# Patient Record
Sex: Female | Born: 1995 | Race: Black or African American | Hispanic: No | Marital: Single | Smoking: Former smoker
Health system: Southern US, Community
[De-identification: ages and names within clinical notes are randomized; demographics above are authoritative.]

## PROBLEM LIST (undated history)

## (undated) DIAGNOSIS — S60552A Superficial foreign body of left hand, initial encounter: Secondary | ICD-10-CM

## (undated) DIAGNOSIS — D649 Anemia, unspecified: Secondary | ICD-10-CM

## (undated) DIAGNOSIS — D573 Sickle-cell trait: Secondary | ICD-10-CM

---
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
## 2018-05-12 ENCOUNTER — Emergency Department (HOSPITAL_COMMUNITY): Payer: Medicaid Other

## 2018-05-12 ENCOUNTER — Emergency Department (HOSPITAL_COMMUNITY)
Admission: EM | Admit: 2018-05-12 | Discharge: 2018-05-12 | Disposition: A | Discharge status: Home / Self Care | Payer: Medicaid Other | Attending: Emergency Medicine | Admitting: Emergency Medicine

## 2018-05-12 ENCOUNTER — Other Ambulatory Visit: Payer: Self-pay

## 2018-05-12 ENCOUNTER — Encounter (HOSPITAL_COMMUNITY): Payer: Self-pay

## 2018-05-12 DIAGNOSIS — R1011 Right upper quadrant pain: Secondary | ICD-10-CM | POA: Insufficient documentation

## 2018-05-12 DIAGNOSIS — R110 Nausea: Secondary | ICD-10-CM | POA: Insufficient documentation

## 2018-05-12 DIAGNOSIS — R1031 Right lower quadrant pain: Secondary | ICD-10-CM | POA: Diagnosis not present

## 2018-05-12 DIAGNOSIS — D72828 Other elevated white blood cell count: Secondary | ICD-10-CM

## 2018-05-12 LAB — CBC WITH DIFFERENTIAL/PLATELET
BASOS PCT: 0 %
Basophils Absolute: 0 10*3/uL (ref 0.0–0.1)
Eosinophils Absolute: 0 10*3/uL (ref 0.0–0.7)
Eosinophils Relative: 0 %
HCT: 29.2 % — ABNORMAL LOW (ref 36.0–46.0)
HEMOGLOBIN: 9.2 g/dL — AB (ref 12.0–15.0)
LYMPHS ABS: 1.2 10*3/uL (ref 0.7–4.0)
Lymphocytes Relative: 5 %
MCH: 18 pg — AB (ref 26.0–34.0)
MCHC: 31.5 g/dL (ref 30.0–36.0)
MCV: 57.3 fL — AB (ref 78.0–100.0)
MONO ABS: 0.8 10*3/uL (ref 0.1–1.0)
MONOS PCT: 4 %
Neutro Abs: 19.9 10*3/uL — ABNORMAL HIGH (ref 1.7–7.7)
Neutrophils Relative %: 91 %
Platelets: 289 10*3/uL (ref 150–400)
RBC: 5.1 MIL/uL (ref 3.87–5.11)
RDW: 17.9 % — AB (ref 11.5–15.5)
WBC: 21.9 10*3/uL — ABNORMAL HIGH (ref 4.0–10.5)

## 2018-05-12 LAB — URINALYSIS, ROUTINE W REFLEX MICROSCOPIC
BACTERIA UA: NONE SEEN
BILIRUBIN URINE: NEGATIVE
GLUCOSE, UA: 50 mg/dL — AB
Ketones, ur: 5 mg/dL — AB
NITRITE: NEGATIVE
Protein, ur: 100 mg/dL — AB
RBC / HPF: >50 RBC/hpf — ABNORMAL HIGH (ref 0–5)
SPECIFIC GRAVITY, URINE: 1.03 (ref 1.005–1.030)
pH: 5 (ref 5.0–8.0)

## 2018-05-12 LAB — COMPREHENSIVE METABOLIC PANEL
ALK PHOS: 75 U/L (ref 38–126)
ALT: 11 U/L (ref 0–44)
ANION GAP: 8 (ref 5–15)
AST: 12 U/L — ABNORMAL LOW (ref 15–41)
Albumin: 3.4 g/dL — ABNORMAL LOW (ref 3.5–5.0)
BILIRUBIN TOTAL: 0.9 mg/dL (ref 0.3–1.2)
BUN: 8 mg/dL (ref 6–20)
CALCIUM: 9 mg/dL (ref 8.9–10.3)
CO2: 20 mmol/L — ABNORMAL LOW (ref 22–32)
Chloride: 113 mmol/L — ABNORMAL HIGH (ref 98–111)
Creatinine, Ser: 0.8 mg/dL (ref 0.44–1.00)
GLUCOSE: 109 mg/dL — AB (ref 70–99)
POTASSIUM: 3.8 mmol/L (ref 3.5–5.1)
Sodium: 141 mmol/L (ref 135–145)
TOTAL PROTEIN: 7.3 g/dL (ref 6.5–8.1)

## 2018-05-12 LAB — WET PREP, GENITAL
CLUE CELLS WET PREP: NONE SEEN
Sperm: NONE SEEN
TRICH WET PREP: NONE SEEN
YEAST WET PREP: NONE SEEN

## 2018-05-12 LAB — I-STAT BETA HCG BLOOD, ED (MC, WL, AP ONLY)

## 2018-05-12 LAB — LIPASE, BLOOD: LIPASE: 21 U/L (ref 11–51)

## 2018-05-12 MED ORDER — HYDROCODONE-ACETAMINOPHEN 5-325 MG PO TABS
1.0000 | ORAL_TABLET | Freq: Once | ORAL | Status: AC
  Administered 2018-05-12: 1 via ORAL
  Filled 2018-05-12: qty 1

## 2018-05-12 MED ORDER — HYDROMORPHONE HCL 1 MG/ML IJ SOLN
1.0000 mg | Freq: Once | INTRAMUSCULAR | Status: AC
  Administered 2018-05-12: 1 mg via INTRAVENOUS
  Filled 2018-05-12: qty 1

## 2018-05-12 MED ORDER — IOPAMIDOL (ISOVUE-300) INJECTION 61%
INTRAVENOUS | Status: AC
  Filled 2018-05-12: qty 100

## 2018-05-12 MED ORDER — HYDROCODONE-ACETAMINOPHEN 5-325 MG PO TABS: ORAL_TABLET | ORAL | 0 refills | Status: DC

## 2018-05-12 MED ORDER — DOXYCYCLINE HYCLATE 100 MG PO CAPS: 100.0000 mg | ORAL_CAPSULE | Freq: Two times a day (BID) | ORAL | 0 refills | Status: DC

## 2018-05-12 MED ORDER — SODIUM CHLORIDE 0.9 % IV BOLUS
1000.0000 mL | Freq: Once | INTRAVENOUS | Status: AC
Start: 2018-05-12 — End: 2018-05-12
  Administered 2018-05-12: 1000 mL via INTRAVENOUS

## 2018-05-12 MED ORDER — MORPHINE SULFATE (PF) 4 MG/ML IV SOLN
4.0000 mg | Freq: Once | INTRAVENOUS | Status: AC
  Administered 2018-05-12: 4 mg via INTRAVENOUS
  Filled 2018-05-12: qty 1

## 2018-05-12 MED ORDER — CEPHALEXIN 500 MG PO CAPS: 500.0000 mg | ORAL_CAPSULE | Freq: Three times a day (TID) | ORAL | 0 refills | Status: DC

## 2018-05-12 MED ORDER — KETOROLAC TROMETHAMINE 15 MG/ML IJ SOLN
15.0000 mg | Freq: Once | INTRAMUSCULAR | Status: AC
  Administered 2018-05-12: 15 mg via INTRAVENOUS
  Filled 2018-05-12: qty 1

## 2018-05-12 MED ORDER — ONDANSETRON HCL 4 MG/2ML IJ SOLN
4.0000 mg | Freq: Once | INTRAMUSCULAR | Status: AC
  Administered 2018-05-12: 4 mg via INTRAVENOUS
  Filled 2018-05-12: qty 2

## 2018-05-12 MED ORDER — SODIUM CHLORIDE 0.9 % IV SOLN
100.0000 mg | Freq: Once | INTRAVENOUS | Status: AC
  Administered 2018-05-12: 100 mg via INTRAVENOUS
  Filled 2018-05-12: qty 100

## 2018-05-12 MED ORDER — SODIUM CHLORIDE 0.9 % IV SOLN
2.0000 g | Freq: Once | INTRAVENOUS | Status: AC
  Administered 2018-05-12: 2 g via INTRAVENOUS
  Filled 2018-05-12: qty 20

## 2018-05-12 MED ORDER — IOPAMIDOL (ISOVUE-300) INJECTION 61%
100.0000 mL | Freq: Once | INTRAVENOUS | Status: AC | PRN
  Administered 2018-05-12: 100 mL via INTRAVENOUS

## 2018-05-12 NOTE — ED Notes (Signed)
US at bedside

## 2018-05-12 NOTE — ED Provider Notes (Signed)
[facility]-EMERGENCY DEPT Provider Note   CSN: [id number] Arrival date & time: 05/12/18  6295     History   Chief Complaint Chief Complaint  Patient presents with  . Flank Pain  . Abdominal Pain    HPI Sharon Stewart is a 22 y.o. female.  HPI 22 year old female with past medical history of sickle cell trait here with lower abdominal pain.  The patient's states that over the last 3 to 4 days, she has had progressively worsening initially periumbilical now right sided abdominal pain.  The pain is aching, throbbing, constant, but worse with movement and palpation.  She said associated intermittent stabbing sensation when she moves.  She has had poor appetite.  Denies any nausea, vomiting, or diarrhea.  Of note, she has a history of recent irregular periods and is currently on her menstrual period, though she states her current pain is very different from her usual pain.  Did not begin acutely.  She states the pain is not colicky.  Denies any dysuria, frequency, or history of kidney stones or frequent bladder or kidney infections.  Denies any alleviating factors.  She has not taken any medications for it.  Denies fevers but has had some chills.  Past Medical History:  Diagnosis Date  . Foreign body of hand, left 02/2012   states is glass  . Sickle cell trait (HCC)     There are no active problems to display for this patient.   Past Surgical History:  Procedure Laterality Date  . FOREIGN BODY REMOVAL  03/16/2012   Procedure: FOREIGN BODY REMOVAL ADULT;  Surgeon: Tami Ribas, MD;  Location: [facility];  Service: Orthopedics;  Laterality: Left;  LEFT HAND     OB History   None      Home Medications    Prior to Admission medications   Medication Sig Start Date End Date Taking? Authorizing Provider  nitrofurantoin, macrocrystal-monohydrate, (MACROBID) 100 MG capsule Take 100 mg by mouth 2 (two) times daily. 04/26/18  Yes [provider]    amoxicillin (AMOXIL) 500 MG capsule Take 2 capsules (1,000 mg total) by mouth 2 (two) times daily. Patient not taking: Reported on 05/12/2018 03/28/16   Elpidio Anis, PA-C  HYDROcodone-acetaminophen [facility]) 5-325 MG per tablet 1-2 tabs po q6 hours prn pain Patient not taking: Reported on 05/12/2018 03/16/12   Betha Loa, MD  ibuprofen (ADVIL,MOTRIN) 600 MG tablet Take 1 tablet (600 mg total) by mouth every 6 (six) hours as needed. Patient not taking: Reported on 05/12/2018 03/28/16   Elpidio Anis, PA-C    Family History Family History  Problem Relation Age of Onset  . Asthma Brother   . Sickle cell trait Brother   . Diabetes Maternal Grandmother   . Hypertension Maternal Grandmother   . Anesthesia problems Mother        post-op nausea  . Anemia Mother   . Sickle cell trait Mother   . Tuberculosis Father        > 12 yrs. ago    Social History Social History   Tobacco Use  . Smoking status: Never Smoker  . Smokeless tobacco: Never Used  Substance Use Topics  . Alcohol use: No  . Drug use: No     Allergies   Patient has no known allergies.   Review of Systems Review of Systems  Constitutional: Positive for fatigue. Negative for chills and fever.  HENT: Negative for congestion and rhinorrhea.   Eyes: Negative for visual  disturbance.  Respiratory: Negative for cough, shortness of breath and wheezing.   Cardiovascular: Negative for chest pain and leg swelling.  Gastrointestinal: Positive for abdominal pain and nausea. Negative for diarrhea and vomiting.  Genitourinary: Positive for menstrual problem and vaginal bleeding. Negative for dysuria and flank pain.  Musculoskeletal: Negative for neck pain and neck stiffness.  Skin: Negative for rash and wound.  Allergic/Immunologic: Negative for immunocompromised state.  Neurological: Positive for weakness. Negative for syncope and headaches.  All other systems reviewed and are negative.    Physical Exam Updated Vital  Signs BP (!) 108/52   Pulse 79   Temp 98.5 F (36.9 C) (Oral)   Resp 17   Ht 5' 6.5" (1.689 m)   Wt 109.3 kg   LMP 05/12/2018   SpO2 100%   BMI 38.32 kg/m   Physical Exam  Constitutional: She is oriented to person, place, and time. She appears well-developed and well-nourished. No distress.  HENT:  Head: Normocephalic and atraumatic.  Eyes: Conjunctivae are normal.  Neck: Neck supple.  Cardiovascular: Regular rhythm and normal heart sounds. Tachycardia present. Exam reveals no friction rub.  No murmur heard. Pulmonary/Chest: Effort normal and breath sounds normal. No respiratory distress. She has no wheezes. She has no rales.  Abdominal: Soft. Normal appearance. She exhibits no distension. There is generalized tenderness. There is rebound, guarding and tenderness at McBurney's point. There is no rigidity.  Musculoskeletal: She exhibits no edema.  Neurological: She is alert and oriented to person, place, and time. She exhibits normal muscle tone.  Skin: Skin is warm. Capillary refill takes less than 2 seconds.  Psychiatric: She has a normal mood and affect.  Nursing note and vitals reviewed.    ED Treatments / Results  Labs (all labs ordered are listed, but only abnormal results are displayed) Labs Reviewed  WET PREP, GENITAL - Abnormal; Notable for the following components:      Result Value   WBC, Wet Prep HPF POC MANY (*)    All other components within normal limits  URINALYSIS, ROUTINE W REFLEX MICROSCOPIC - Abnormal; Notable for the following components:   APPearance TURBID (*)    Glucose, UA 50 (*)    Hgb urine dipstick LARGE (*)    Ketones, ur 5 (*)    Protein, ur 100 (*)    Leukocytes, UA SMALL (*)    RBC / HPF >50 (*)    All other components within normal limits  CBC WITH DIFFERENTIAL/PLATELET - Abnormal; Notable for the following components:   WBC 21.9 (*)    Hemoglobin 9.2 (*)    HCT 29.2 (*)    MCV 57.3 (*)    MCH 18.0 (*)    RDW 17.9 (*)    Neutro  Abs 19.9 (*)    All other components within normal limits  COMPREHENSIVE METABOLIC PANEL - Abnormal; Notable for the following components:   Chloride 113 (*)    CO2 20 (*)    Glucose, Bld 109 (*)    Albumin 3.4 (*)    AST 12 (*)    All other components within normal limits  LIPASE, BLOOD  I-STAT BETA HCG BLOOD, ED ([facility], [facility], [facility] ONLY)  GC/CHLAMYDIA PROBE AMP ([place]) NOT AT [facility]    EKG None  Radiology Ct Abdomen Pelvis W Contrast  Result Date: 05/12/2018 CLINICAL DATA:  Constant right flank and lower abdominal pain for 3-4 days EXAM: CT ABDOMEN AND PELVIS WITH CONTRAST TECHNIQUE: Multidetector CT imaging of the abdomen and  pelvis was performed using the standard protocol following bolus administration of intravenous contrast. CONTRAST:  ISOVUE-300 IOPAMIDOL (ISOVUE-300) INJECTION 61% COMPARISON:  None. FINDINGS: Lower chest:  No contributory findings. Hepatobiliary: No focal liver abnormality.No evidence of biliary obstruction or stone. Pancreas: Unremarkable. Spleen: Unremarkable. Adrenals/Urinary Tract: Negative adrenals. No hydronephrosis or stone. Unremarkable bladder. Stomach/Bowel: The appendix is difficult to distinguish from neighboring small bowel loops due to lack of intravenous contrast. Best visualized on axial slices the appendix is intermittently seen as non thickened or hyperenhancing. No definite bowel inflammation. No bowel obstruction. Vascular/Lymphatic: No acute vascular abnormality. No mass or adenopathy. Reproductive:There is mild fat haziness and possible peritoneal thickening in the pelvis with trace fluid. No hydrosalpinx or collection is seen when accounting for low-density ovarian appearance bilaterally. Other: No pneumoperitoneum. Musculoskeletal: No acute abnormalities. IMPRESSION: Inflammatory changes in the pelvis without definitive source. The appendix is difficult to visualize separate from small bowel loops in the absence of oral contrast; where  intermittently visible there is no detected appendicitis. Given the pelvic findings are somewhat bilateral, question PID. Electronically Signed   By: Marnee Spring M.D.   On: 05/12/2018 11:36   US Abdomen Limited Ruq  Result Date: 05/12/2018 CLINICAL DATA:  Right upper quadrant pain and elevated white blood cell count EXAM: ULTRASOUND ABDOMEN LIMITED RIGHT UPPER QUADRANT COMPARISON:  None. FINDINGS: Gallbladder: No gallstones or wall thickening visualized. No sonographic Murphy sign noted by sonographer. Common bile duct: Diameter: 4.8 mm. Liver: No focal lesion identified. Within normal limits in parenchymal echogenicity. Portal vein is patent on color Doppler imaging with normal direction of blood flow towards the liver. IMPRESSION: No acute abnormality noted. Electronically Signed   By: Alcide Clever M.D.   On: 05/12/2018 15:28    Procedures Procedures (including critical care time)  Medications Ordered in ED Medications  iopamidol (ISOVUE-300) 61 % injection (has no administration in time range)  HYDROmorphone (DILAUDID) injection 1 mg (1 mg Intravenous Given 05/12/18 1039)  sodium chloride 0.9 % bolus 1,000 mL (0 mLs Intravenous Stopped 05/12/18 1222)  ondansetron (ZOFRAN) injection 4 mg (4 mg Intravenous Given 05/12/18 1039)  iopamidol (ISOVUE-300) 61 % injection 100 mL (100 mLs Intravenous Contrast Given 05/12/18 1114)  ketorolac (TORADOL) 15 MG/ML injection 15 mg (15 mg Intravenous Given 05/12/18 1224)  morphine 4 MG/ML injection 4 mg (4 mg Intravenous Given 05/12/18 1224)  cefTRIAXone (ROCEPHIN) 2 g in sodium chloride 0.9 % 100 mL IVPB (0 g Intravenous Stopped 05/12/18 1340)  doxycycline (VIBRAMYCIN) 100 mg in sodium chloride 0.9 % 250 mL IVPB (0 mg Intravenous Stopped 05/12/18 1551)  morphine 4 MG/ML injection 4 mg (4 mg Intravenous Given 05/12/18 1341)     Initial Impression / Assessment and Plan / ED Course  I have reviewed the triage vital signs and the nursing notes.  Pertinent labs  & imaging results that were available during my care of the patient were reviewed by me and considered in my medical decision making (see chart for details).     22 year old female here with exquisite right lower quadrant and lower abdominal pain.  Denies any vaginal discharge and last intercourse was greater than 1 month ago.  She has no significant cervical motion tenderness on my exam.  CT imaging shows significant amount of bilateral pelvic free fluid.  Discussed the case with surgery on-call, who recommended ultrasound.  Ultrasound shows small amount of complex fluid in the pelvis and right adnexa, without abscess.  No evidence of torsion.  Given her significant  leukocytosis, tenderness on exam, absence of cervical motion tenderness, will be discussed with general surgery. May be PID/occult early TOA but given absence of CMT and high risk sexual activity, with TTP throughout R abdomen on exam, Gen surg to see.  Patient care transferred to Dr. Ranae PalmsYelverton at the end of my shift. Patient presentation, ED course, and plan of care discussed with review of all pertinent labs and imaging. Please see his/her note for further details regarding further ED course and disposition.  Final Clinical Impressions(s) / ED Diagnoses   Final diagnoses:  RUQ pain  RLQ abdominal pain  Other elevated white blood cell (WBC) count    ED Discharge Orders    None       Shaune PollackIsaacs, Sameen Leas, MD 05/12/18 1650

## 2018-05-12 NOTE — ED Provider Notes (Signed)
22 year old female with 4 days of abdominal pain.  Signed out from Dr. Sheria Langameron pending surgical evaluation.  Dr. Johna SheriffHoxworth evaluated patient.  Does not believe symptoms are due to appendicitis.  Patient is well-appearing.  Stable vital signs.  Continues to have diffusely tender abdomen without guarding.  She does have some right flank pain.  States she had UTI earlier in the month and was treated with antibiotics.  Question mild pyelonephritis versus PID.  Discussed with Dr. Logan BoresEvans, OB/GYN on-call.  Agrees with plan to treat with doxycycline and Keflex in addition to the 2 g of Rocephin patient has received in the emergency department.  She will follow-up in women's clinic to assure resolution of symptoms.  She is been given strict return precautions and is voiced understanding.   Loren RacerYelverton, Opal Dinning, MD 05/12/18 2008

## 2018-05-12 NOTE — Consult Note (Signed)
Reason for Consult: Right-sided abdominal pain, possible appendicitis Referring Physician: Duffy Bruce, [place] is an 22 y.o. female.  HPI: Patient is a 22 year old female with history of sickle cell trait who presents to the emergency department with right-sided abdominal pain.  She states that for 1 to 2 months she has had some intermittent sharp pain in her high right flank laterally under her rib cage.  This has not been extremely significant or severe.  No symptoms associated with this.  She was according to her seen and an urgent care about 2 weeks ago and given a course of antibiotics for a urinary infection.  About 4 days ago she had the onset of recurrent right flank and subcostal pain associated with lower and somewhat diffuse abdominal pain greater on the right than the left.  This has been persistent and fairly severe.  She has not had any nausea or vomiting.  Some lack of appetite which she feels is due to the pain.  Her menstrual period started about the time of the pain.  This is been heavier than usual.  No apparent history of PID.  Last sexual contact over 1 month ago.  No vaginal discharge.  The pain is worse with motion.  She indicates across her lower abdomen in the midline and in her right flank high under the rib cage as the most painful areas.  No history of similar symptoms.  Past Medical History:  Diagnosis Date  . Foreign body of hand, left 02/2012   states is glass  . Sickle cell trait [facility])     Past Surgical History:  Procedure Laterality Date  . FOREIGN BODY REMOVAL  03/16/2012   Procedure: FOREIGN BODY REMOVAL ADULT;  Surgeon: Tennis Must, MD;  Location: [facility];  Service: Orthopedics;  Laterality: Left;  LEFT HAND    Family History  Problem Relation Age of Onset  . Asthma Brother   . Sickle cell trait Brother   . Diabetes Maternal Grandmother   . Hypertension Maternal Grandmother   . Anesthesia problems Mother        post-op  nausea  . Anemia Mother   . Sickle cell trait Mother   . Tuberculosis Father        > 12 yrs. ago    Social History:  reports that she has never smoked. She has never used smokeless tobacco. She reports that she does not drink alcohol or use drugs.  Allergies: No Known Allergies  Current Facility-Administered Medications  Medication Dose Route Frequency Provider Last Rate Last Dose  . iopamidol (ISOVUE-300) 61 % injection            Current Outpatient Medications  Medication Sig Dispense Refill  . nitrofurantoin, macrocrystal-monohydrate, (MACROBID) 100 MG capsule Take 100 mg by mouth 2 (two) times daily.  0  . amoxicillin (AMOXIL) 500 MG capsule Take 2 capsules (1,000 mg total) by mouth 2 (two) times daily. (Patient not taking: Reported on 05/12/2018) 40 capsule 0  . HYDROcodone-acetaminophen (NORCO) 5-325 MG per tablet 1-2 tabs po q6 hours prn pain (Patient not taking: Reported on 05/12/2018) 20 tablet 0  . ibuprofen (ADVIL,MOTRIN) 600 MG tablet Take 1 tablet (600 mg total) by mouth every 6 (six) hours as needed. (Patient not taking: Reported on 05/12/2018) 30 tablet 0     Results for orders placed or performed during the hospital encounter of 05/12/18 (from the past 48 hour(s))  CBC with Differential     Status:  Abnormal   Collection Time: 05/12/18  9:25 AM  Result Value Ref Range   WBC 21.9 (H) 4.0 - 10.5 K/uL   RBC 5.10 3.87 - 5.11 MIL/uL   Hemoglobin 9.2 (L) 12.0 - 15.0 g/dL   HCT 29.2 (L) 36.0 - 46.0 %   MCV 57.3 (L) 78.0 - 100.0 fL   MCH 18.0 (L) 26.0 - 34.0 pg   MCHC 31.5 30.0 - 36.0 g/dL   RDW 17.9 (H) 11.5 - 15.5 %   Platelets 289 150 - 400 K/uL   Neutrophils Relative % 91 %   Neutro Abs 19.9 (H) 1.7 - 7.7 K/uL   Lymphocytes Relative 5 %   Lymphs Abs 1.2 0.7 - 4.0 K/uL   Monocytes Relative 4 %   Monocytes Absolute 0.8 0.1 - 1.0 K/uL   Eosinophils Relative 0 %   Eosinophils Absolute 0.0 0.0 - 0.7 K/uL   Basophils Relative 0 %   Basophils Absolute 0.0 0.0 - 0.1  K/uL    Comment: Performed at [facility], [place] [address] [place], [place] [address]  Comprehensive metabolic panel     Status: Abnormal   Collection Time: 05/12/18  9:25 AM  Result Value Ref Range   Sodium 141 135 - 145 mmol/L   Potassium 3.8 3.5 - 5.1 mmol/L   Chloride 113 (H) 98 - 111 mmol/L   CO2 20 (L) 22 - 32 mmol/L   Glucose, Bld 109 (H) 70 - 99 mg/dL   BUN 8 6 - 20 mg/dL   Creatinine, Ser 0.80 0.44 - 1.00 mg/dL   Calcium 9.0 8.9 - 10.3 mg/dL   Total Protein 7.3 6.5 - 8.1 g/dL   Albumin 3.4 (L) 3.5 - 5.0 g/dL   AST 12 (L) 15 - 41 U/L   ALT 11 0 - 44 U/L   Alkaline Phosphatase 75 38 - 126 U/L   Total Bilirubin 0.9 0.3 - 1.2 mg/dL   GFR calc non Af Amer >60 >60 mL/min   GFR calc Af Amer >60 >60 mL/min    Comment: (NOTE) The eGFR has been calculated using the CKD EPI equation. This calculation has not been validated in all clinical situations. eGFR's persistently <60 mL/min signify possible Chronic Kidney Disease.    Anion gap 8 5 - 15    Comment: Performed at [facility], [place] [address] [place], [place] [address]  Lipase, blood     Status: None   Collection Time: 05/12/18  9:25 AM  Result Value Ref Range   Lipase 21 11 - 51 U/L    Comment: Performed at [facility], [place] [address], [place], [place] [address]  I-Stat beta hCG blood, ED     Status: None   Collection Time: 05/12/18  9:29 AM  Result Value Ref Range   I-stat hCG, quantitative <5.0 <5 mIU/mL   Comment 3            Comment:   GEST. AGE      CONC.  (mIU/mL)   <=1 WEEK        5 - 50     2 WEEKS       50 - 500     3 WEEKS       100 - 10,000     4 WEEKS     1,000 - 30,000        FEMALE AND NON-PREGNANT FEMALE:     LESS THAN 5 mIU/mL   Urinalysis, Routine w  reflex microscopic- may I&O cath if menses     Status: Abnormal   Collection Time: 05/12/18 10:50 AM  Result Value Ref Range   Color, Urine YELLOW YELLOW   APPearance TURBID (A) CLEAR    Specific Gravity, Urine 1.030 1.005 - 1.030   pH 5.0 5.0 - 8.0   Glucose, UA 50 (A) NEGATIVE mg/dL   Hgb urine dipstick LARGE (A) NEGATIVE   Bilirubin Urine NEGATIVE NEGATIVE   Ketones, ur 5 (A) NEGATIVE mg/dL   Protein, ur 100 (A) NEGATIVE mg/dL   Nitrite NEGATIVE NEGATIVE   Leukocytes, UA SMALL (A) NEGATIVE   RBC / HPF >50 (H) 0 - 5 RBC/hpf   WBC, UA 11-20 0 - 5 WBC/hpf   Bacteria, UA NONE SEEN NONE SEEN   Mucus PRESENT    Amorphous Crystal PRESENT     Comment: Performed at [facility], [place] [address] [place], [place] [address]  Wet prep, genital     Status: Abnormal   Collection Time: 05/12/18 12:58 PM  Result Value Ref Range   Yeast Wet Prep HPF POC NONE SEEN NONE SEEN   Trich, Wet Prep NONE SEEN NONE SEEN   Clue Cells Wet Prep HPF POC NONE SEEN NONE SEEN   WBC, Wet Prep HPF POC MANY (A) NONE SEEN   Sperm NONE SEEN     Comment: Performed at [facility], [place] [address] [place], [place] [address]    Ct Abdomen Pelvis W Contrast  Result Date: 05/12/2018 CLINICAL DATA:  Constant right flank and lower abdominal pain for 3-4 days EXAM: CT ABDOMEN AND PELVIS WITH CONTRAST TECHNIQUE: Multidetector CT imaging of the abdomen and pelvis was performed using the standard protocol following bolus administration of intravenous contrast. CONTRAST:  156m ISOVUE-300 IOPAMIDOL (ISOVUE-300) INJECTION 61% COMPARISON:  None. FINDINGS: Lower chest:  No contributory findings. Hepatobiliary: No focal liver abnormality.No evidence of biliary obstruction or stone. Pancreas: Unremarkable. Spleen: Unremarkable. Adrenals/Urinary Tract: Negative adrenals. No hydronephrosis or stone. Unremarkable bladder. Stomach/Bowel: The appendix is difficult to distinguish from neighboring small bowel loops due to lack of intravenous contrast. Best visualized on axial slices the appendix is intermittently seen as non thickened or hyperenhancing. No definite bowel inflammation. No bowel  obstruction. Vascular/Lymphatic: No acute vascular abnormality. No mass or adenopathy. Reproductive:There is mild fat haziness and possible peritoneal thickening in the pelvis with trace fluid. No hydrosalpinx or collection is seen when accounting for low-density ovarian appearance bilaterally. Other: No pneumoperitoneum. Musculoskeletal: No acute abnormalities. IMPRESSION: Inflammatory changes in the pelvis without definitive source. The appendix is difficult to visualize separate from small bowel loops in the absence of oral contrast; where intermittently visible there is no detected appendicitis. Given the pelvic findings are somewhat bilateral, question PID. Electronically Signed   By: JMonte FantasiaM.D.   On: 05/12/2018 11:36   [place]domen Limited Ruq  Result Date: 05/12/2018 CLINICAL DATA:  Right upper quadrant pain and elevated white blood cell count EXAM: ULTRASOUND ABDOMEN LIMITED RIGHT UPPER QUADRANT COMPARISON:  None. FINDINGS: Gallbladder: No gallstones or wall thickening visualized. No sonographic Murphy sign noted by sonographer. Common bile duct: Diameter: 4.8 mm. Liver: No focal lesion identified. Within normal limits in parenchymal echogenicity. Portal vein is patent on color Doppler imaging with normal direction of blood flow towards the liver. IMPRESSION: No acute abnormality noted. Electronically Signed   By: MInez CatalinaM.D.   On: 05/12/2018 15:28    ROS Blood pressure 115/63, pulse 85, temperature 98.5 F (36.9  C), temperature source Oral, resp. rate 17, height 5' 6.5" (1.689 m), weight 109.3 kg, last menstrual period 05/12/2018, SpO2 100 %. Physical Exam General: Alert, moderately obese African-American female, in no severe distress Skin: Warm and dry without rash or infection. HEENT: No palpable masses or thyromegaly. Sclera nonicteric. Pupils equal round and reactive.  Lymph nodes: No cervical, supraclavicular,  nodes palpable. Lungs: Breath sounds clear and equal without  increased work of breathing Cardiovascular: Regular rate and rhythm without murmur. No JVD or edema.  Abdomen: Nondistended.  Bowel sounds are hypoactive.  There is diffuse not well localized abdominal tenderness but seems at this point to be greatest in the right upper quadrant and generally across the lower abdomen and both lower quadrants.   Extremities: No edema or joint swelling or deformity. No chronic venous stasis changes. Neurologic: Alert and fully oriented.  Affect normal.  No gross motor deficits.  Assessment/Plan: 22 year old female with 4 days of abdominal pain consisting of right flank pain and somewhat more diffuse lower abdominal pain.  No significant associated GI symptoms.  I have personally reviewed her CT scan.  As noted the appendix is somewhat difficult to visualize but portions of the appendix are visible and appear normal.  There is no significant localized inflammation around the appendix but rather more diffuse mild pelvic inflammatory changes.  Overall I think it is very unlikely she has acute appendicitis.  Pain in her right flank and under her right rib cage seems more consistent with more diffuse fluid in the abdomen such as with PID.  This was discussed with Dr. Lita Mains.  I would not recommend surgical intervention for appendicitis.  He will discuss with OB/GYN but I think treatment with antibiotics for PID and close observation would be most appropriate.  This was discussed with the patient.  She understands the diagnosis is not 100% firm and if she is treated as an outpatient and is not improving over the next 2 to 3 days she should return for reevaluation.  Darene Lamer Ugonna Keirsey 05/12/2018, 6:22 PM

## 2018-05-12 NOTE — ED Notes (Signed)
Family at bedside. 

## 2018-05-12 NOTE — ED Notes (Signed)
Patient given ginger ale. 

## 2018-05-12 NOTE — ED Triage Notes (Addendum)
Patient c/o constant right flank and right lower abdominal pain x 3-4 days. Patient denie any N/V/D.  Patient added that when she tries to sit to urinate she has increased pain of the right flank and right lower abdominal area.

## 2018-05-14 LAB — URINE CULTURE: CULTURE: NO GROWTH

## 2018-05-15 LAB — GC/CHLAMYDIA PROBE AMP (~~LOC~~) NOT AT ARMC
Chlamydia: POSITIVE — AB
Neisseria Gonorrhea: POSITIVE — AB

## 2018-05-16 ENCOUNTER — Telehealth: Payer: Self-pay | Admitting: Student

## 2018-05-16 DIAGNOSIS — A749 Chlamydial infection, unspecified: Secondary | ICD-10-CM

## 2018-05-16 MED ORDER — AZITHROMYCIN 500 MG PO TABS: 1000.0000 mg | ORAL_TABLET | Freq: Once | ORAL | 0 refills | Status: AC

## 2018-05-16 NOTE — Telephone Encounter (Signed)
Encounter opened in error

## 2018-05-16 NOTE — Telephone Encounter (Addendum)
-----   Message from Kathe BectonLori S Berdik, RN sent at 05/16/2018 10:54 AM EDT ----- This patient tested positive for:  chlamydia   She "has NKDA", I have informed the patient of her results and confirmed her pharmacy is correct in her chart. Please send Rx.   Thank you,   Kathe BectonBerdik, Lori S, RN   [facility]

## 2018-05-16 NOTE — Telephone Encounter (Addendum)
Sharon Stewart tested positive for  Chlamydia & gonorrhea. Was given rocephin in ED during her visit. Patient was called by RN and allergies and pharmacy confirmed. Rx sent to pharmacy of choice.   Judeth HornLawrence, Rhylen Pulido, NP 05/16/2018 11:47 AM        ----- Message from Kathe BectonLori S Berdik, RN sent at 05/16/2018 10:54 AM EDT ----- This patient tested positive for:  chlamydia   She "has NKDA", I have informed the patient of her results and confirmed her pharmacy is correct in her chart. Please send Rx.   Thank you,   Kathe BectonBerdik, Lori S, RN   [facility].

## 2018-07-21 ENCOUNTER — Encounter (HOSPITAL_COMMUNITY): Payer: Self-pay | Admitting: Emergency Medicine

## 2018-07-21 ENCOUNTER — Other Ambulatory Visit: Payer: Self-pay

## 2018-07-21 ENCOUNTER — Emergency Department (HOSPITAL_COMMUNITY)
Admission: EM | Admit: 2018-07-21 | Discharge: 2018-07-22 | Disposition: A | Discharge status: Home / Self Care | Payer: Medicaid Other | Attending: Emergency Medicine | Admitting: Emergency Medicine

## 2018-07-21 DIAGNOSIS — N72 Inflammatory disease of cervix uteri: Secondary | ICD-10-CM | POA: Diagnosis not present

## 2018-07-21 DIAGNOSIS — R1084 Generalized abdominal pain: Secondary | ICD-10-CM

## 2018-07-21 DIAGNOSIS — R109 Unspecified abdominal pain: Secondary | ICD-10-CM | POA: Diagnosis present

## 2018-07-21 HISTORY — DX: Anemia, unspecified: D64.9

## 2018-07-21 LAB — URINALYSIS, ROUTINE W REFLEX MICROSCOPIC
BACTERIA UA: NONE SEEN
Bilirubin Urine: NEGATIVE
GLUCOSE, UA: NEGATIVE mg/dL
KETONES UR: 5 mg/dL — AB
Nitrite: NEGATIVE
Protein, ur: 100 mg/dL — AB
RBC / HPF: >50 RBC/hpf — ABNORMAL HIGH (ref 0–5)
Specific Gravity, Urine: 1.024 (ref 1.005–1.030)
pH: 6 (ref 5.0–8.0)

## 2018-07-21 LAB — POC URINE PREG, ED: Preg Test, Ur: NEGATIVE

## 2018-07-21 LAB — WET PREP, GENITAL
Clue Cells Wet Prep HPF POC: NONE SEEN
Sperm: NONE SEEN
TRICH WET PREP: NONE SEEN
Yeast Wet Prep HPF POC: NONE SEEN

## 2018-07-21 MED ORDER — ONDANSETRON 4 MG PO TBDP
4.0000 mg | ORAL_TABLET | Freq: Once | ORAL | Status: AC
  Administered 2018-07-22: 4 mg via ORAL
  Filled 2018-07-21: qty 1

## 2018-07-21 MED ORDER — AZITHROMYCIN 250 MG PO TABS
1000.0000 mg | ORAL_TABLET | Freq: Once | ORAL | Status: AC
  Administered 2018-07-22: 1000 mg via ORAL
  Filled 2018-07-21: qty 4

## 2018-07-21 MED ORDER — CEFTRIAXONE SODIUM 250 MG IJ SOLR
250.0000 mg | Freq: Once | INTRAMUSCULAR | Status: AC
  Administered 2018-07-22: 250 mg via INTRAMUSCULAR
  Filled 2018-07-21: qty 250

## 2018-07-21 MED ORDER — IBUPROFEN 600 MG PO TABS: 600.0000 mg | ORAL_TABLET | Freq: Four times a day (QID) | ORAL | 0 refills | Status: DC | PRN

## 2018-07-21 NOTE — ED Notes (Signed)
Urine culture sent to the lab. 

## 2018-07-21 NOTE — ED Triage Notes (Signed)
Pt from home with c/o right flank pain. Pt states she had similar pain before and was told it was a UTI and a kidney infection (in August). Pt denies urinary symptoms. No n/v/d. Pt denies any medications at home for this. Pt is afebrile.

## 2018-07-21 NOTE — Discharge Instructions (Addendum)
Return if symptoms persist.  You have cultures pending.

## 2018-07-21 NOTE — ED Provider Notes (Signed)
[facility]-EMERGENCY DEPT Provider Note   CSN: [id number] Arrival date & time: 07/21/18  1915     History   Chief Complaint Chief Complaint  Patient presents with  . Flank Pain    HPI Sharon Stewart is a 22 y.o. female.  The history is provided by the patient. No language interpreter was used.  Flank Pain  This is a recurrent problem. The current episode started yesterday. The problem has been gradually worsening. Associated symptoms include abdominal pain. Nothing aggravates the symptoms. Nothing relieves the symptoms. She has tried nothing for the symptoms. The treatment provided no relief.  Pt denies vaginal discharge.  Pt has gc and ct in September.  Pt reports she took antibiotics   Past Medical History:  Diagnosis Date  . Anemia   . Foreign body of hand, left 02/2012   states is glass  . Sickle cell trait (HCC)     There are no active problems to display for this patient.   Past Surgical History:  Procedure Laterality Date  . FOREIGN BODY REMOVAL  03/16/2012   Procedure: FOREIGN BODY REMOVAL ADULT;  Surgeon: Tami Ribas, MD;  Location: [facility];  Service: Orthopedics;  Laterality: Left;  LEFT HAND     OB History   None      Home Medications    Prior to Admission medications   Medication Sig Start Date End Date Taking? Authorizing Provider  amoxicillin (AMOXIL) 500 MG capsule Take 2 capsules (1,000 mg total) by mouth 2 (two) times daily. Patient not taking: Reported on 05/12/2018 03/28/16   Elpidio Anis, PA-C  cephALEXin (KEFLEX) 500 MG capsule Take 1 capsule (500 mg total) by mouth 3 (three) times daily. 05/12/18   Loren Racer, MD  doxycycline (VIBRAMYCIN) 100 MG capsule Take 1 capsule (100 mg total) by mouth 2 (two) times daily. One po bid x 7 days 05/12/18   Loren Racer, MD  HYDROcodone-acetaminophen [facility]) 5-325 MG tablet 1-2 tabs po q6 hours prn pain 05/12/18   Loren Racer, MD  ibuprofen (ADVIL,MOTRIN)  600 MG tablet Take 1 tablet (600 mg total) by mouth every 6 (six) hours as needed. Patient not taking: Reported on 05/12/2018 03/28/16   Elpidio Anis, PA-C  nitrofurantoin, macrocrystal-monohydrate, (MACROBID) 100 MG capsule Take 100 mg by mouth 2 (two) times daily. 04/26/18   [provider]    Family History Family History  Problem Relation Age of Onset  . Asthma Brother   . Sickle cell trait Brother   . Diabetes Maternal Grandmother   . Hypertension Maternal Grandmother   . Anesthesia problems Mother        post-op nausea  . Anemia Mother   . Sickle cell trait Mother   . Tuberculosis Father        > 12 yrs. ago    Social History Social History   Tobacco Use  . Smoking status: Never Smoker  . Smokeless tobacco: Never Used  Substance Use Topics  . Alcohol use: No  . Drug use: No     Allergies   Patient has no known allergies.   Review of Systems Review of Systems  Gastrointestinal: Positive for abdominal pain.  Genitourinary: Positive for flank pain.  All other systems reviewed and are negative.    Physical Exam Updated Vital Signs BP 129/84 (BP Location: Right Arm)   Pulse 77   Temp 97.9 F (36.6 C) (Oral)   Resp 16   LMP 07/21/2018   SpO2 99%  Physical Exam  Constitutional: She appears well-developed and well-nourished. No distress.  HENT:  Head: Normocephalic and atraumatic.  Eyes: Conjunctivae are normal.  Neck: Neck supple.  Cardiovascular: Normal rate and regular rhythm.  No murmur heard. Pulmonary/Chest: Effort normal and breath sounds normal. No respiratory distress.  Abdominal: Soft. There is no tenderness.  Genitourinary: Vaginal discharge found.  Genitourinary Comments: Dark blot,  Diffusely tender, cervix tender ,    Musculoskeletal: She exhibits no edema.  Neurological: She is alert.  Skin: Skin is warm and dry.  Psychiatric: She has a normal mood and affect.  Nursing note and vitals reviewed.    ED Treatments / Results    Labs (all labs ordered are listed, but only abnormal results are displayed) Labs Reviewed  URINALYSIS, ROUTINE W REFLEX MICROSCOPIC - Abnormal; Notable for the following components:      Result Value   Color, Urine RED (*)    APPearance CLOUDY (*)    Hgb urine dipstick LARGE (*)    Ketones, ur 5 (*)    Protein, ur 100 (*)    Leukocytes, UA TRACE (*)    RBC / HPF >50 (*)    All other components within normal limits  POC URINE PREG, ED    EKG None  Radiology No results found.  Procedures Procedures (including critical care time)  Medications Ordered in ED Medications - No data to display   Initial Impression / Assessment and Plan / ED Course  I have reviewed the triage vital signs and the nursing notes.  Pertinent labs & imaging results that were available during my care of the patient were reviewed by me and considered in my medical decision making (see chart for details).     Wet prep/GC and Ct pending.  Pt given Rocephin IM Zithromax.  zofran odt and hydrocodone here   Final Clinical Impressions(s) / ED Diagnoses   Final diagnoses:  Cervicitis    ED Discharge Orders         Ordered    ibuprofen (ADVIL,MOTRIN) 600 MG tablet  Every 6 hours PRN     07/21/18 2325        An After Visit Summary was printed and given to the patient.    Elson AreasSofia, Darlene Bartelt K, PA-C 07/21/18 2331    Samuel JesterMcManus, Kathleen, DO 07/24/18 [phone]

## 2018-07-22 MED ORDER — LIDOCAINE HCL 1 % IJ SOLN
INTRAMUSCULAR | Status: AC
  Administered 2018-07-22: 20 mL
  Filled 2018-07-22: qty 20

## 2018-07-24 LAB — GC/CHLAMYDIA PROBE AMP (~~LOC~~) NOT AT ARMC
CHLAMYDIA, DNA PROBE: NEGATIVE
NEISSERIA GONORRHEA: NEGATIVE

## 2019-02-16 ENCOUNTER — Encounter (HOSPITAL_BASED_OUTPATIENT_CLINIC_OR_DEPARTMENT_OTHER): Payer: Self-pay | Admitting: *Deleted

## 2019-02-16 ENCOUNTER — Emergency Department (HOSPITAL_BASED_OUTPATIENT_CLINIC_OR_DEPARTMENT_OTHER)
Admission: EM | Admit: 2019-02-16 | Discharge: 2019-02-16 | Disposition: A | Discharge status: Home / Self Care | Payer: Medicaid Other | Attending: Emergency Medicine | Admitting: Emergency Medicine

## 2019-02-16 ENCOUNTER — Other Ambulatory Visit: Payer: Self-pay

## 2019-02-16 DIAGNOSIS — Z79899 Other long term (current) drug therapy: Secondary | ICD-10-CM | POA: Insufficient documentation

## 2019-02-16 DIAGNOSIS — K047 Periapical abscess without sinus: Secondary | ICD-10-CM

## 2019-02-16 DIAGNOSIS — R600 Localized edema: Secondary | ICD-10-CM | POA: Diagnosis present

## 2019-02-16 MED ORDER — PENICILLIN V POTASSIUM 500 MG PO TABS: 500.0000 mg | ORAL_TABLET | Freq: Four times a day (QID) | ORAL | 0 refills | Status: AC

## 2019-02-16 MED ORDER — TRAMADOL HCL 50 MG PO TABS: 50.0000 mg | ORAL_TABLET | Freq: Four times a day (QID) | ORAL | 0 refills | Status: DC | PRN

## 2019-02-16 MED FILL — traMADol HCL 50 MG TABS: 50 | 2 days supply | Qty: 8 | Fill #0

## 2019-02-16 MED FILL — PENICILLIN VK 500 MG TABLET: 500 | 7 days supply | Qty: 28 | Fill #0

## 2019-02-16 NOTE — Discharge Instructions (Signed)
Take ibuprofen 600 mg every 6 hours as needed for pain/swelling. You need to follow-up with a dentist as soon as you can.

## 2019-02-16 NOTE — ED Triage Notes (Signed)
Pt reports right sided face swelling x this am. Denies fevers.

## 2019-02-18 NOTE — ED Provider Notes (Signed)
[facility] EMERGENCY DEPARTMENT Provider Note   CSN: [id number] Arrival date & time: 02/16/19  0754     History   Chief Complaint Chief Complaint  Patient presents with  . Facial Swelling    HPI Sharon Stewart is a 23 y.o. female.     HPI   23 year old female with right-sided facial swelling.  Onset this morning.  Persistent since then.  Mild pain.  No fevers or chills.  No neck pain or sore throat.  Past Medical History:  Diagnosis Date  . Anemia   . Foreign body of hand, left 02/2012   states is glass  . Sickle cell trait ([facility])     There are no active problems to display for this patient.   Past Surgical History:  Procedure Laterality Date  . FOREIGN BODY REMOVAL  03/16/2012   Procedure: FOREIGN BODY REMOVAL ADULT;  Surgeon: Tennis Must, MD;  Location: [facility];  Service: Orthopedics;  Laterality: Left;  LEFT HAND     OB History   No obstetric history on file.      Home Medications    Prior to Admission medications   Medication Sig Start Date End Date Taking? Authorizing Provider  amoxicillin (AMOXIL) 500 MG capsule Take 2 capsules (1,000 mg total) by mouth 2 (two) times daily. Patient not taking: Reported on 05/12/2018 03/28/16   Charlann Lange, PA-C  cephALEXin (KEFLEX) 500 MG capsule Take 1 capsule (500 mg total) by mouth 3 (three) times daily. 05/12/18   Julianne Rice, MD  doxycycline (VIBRAMYCIN) 100 MG capsule Take 1 capsule (100 mg total) by mouth 2 (two) times daily. One po bid x 7 days 05/12/18   Julianne Rice, MD  HYDROcodone-acetaminophen [facility]) 5-325 MG tablet 1-2 tabs po q6 hours prn pain 05/12/18   Julianne Rice, MD  ibuprofen (ADVIL,MOTRIN) 600 MG tablet Take 1 tablet (600 mg total) by mouth every 6 (six) hours as needed. 07/21/18   Fransico Meadow, PA-C  nitrofurantoin, macrocrystal-monohydrate, (MACROBID) 100 MG capsule Take 100 mg by mouth 2 (two) times daily. 04/26/18   [provider]  penicillin v  potassium (VEETID) 500 MG tablet Take 1 tablet (500 mg total) by mouth 4 (four) times daily for 7 days. 02/16/19 02/23/19  Virgel Manifold, MD  traMADol (ULTRAM) 50 MG tablet Take 1 tablet (50 mg total) by mouth every 6 (six) hours as needed. 02/16/19   Virgel Manifold, MD    Family History Family History  Problem Relation Age of Onset  . Asthma Brother   . Sickle cell trait Brother   . Diabetes Maternal Grandmother   . Hypertension Maternal Grandmother   . Anesthesia problems Mother        post-op nausea  . Anemia Mother   . Sickle cell trait Mother   . Tuberculosis Father        > 12 yrs. ago    Social History Social History   Tobacco Use  . Smoking status: Former Research scientist (life sciences)  . Smokeless tobacco: Never Used  Substance Use Topics  . Alcohol use: No  . Drug use: No     Allergies   Patient has no known allergies.   Review of Systems Review of Systems All systems reviewed and negative, other than as noted in HPI.   Physical Exam Updated Vital Signs BP 107/80 (BP Location: Right Arm)   Pulse 76   Temp 98.4 F (36.9 C) (Oral)   Resp 18   Ht 5\' 7"  (1.702  m)   Wt 93 kg   SpO2 100%   BMI 32.11 kg/m   Physical Exam Vitals signs and nursing note reviewed.  Constitutional:      General: She is not in acute distress.    Appearance: She is well-developed.  HENT:     Head: Normocephalic.     Comments: Mild right facial fullness.  Right upper premolar is cracked.  No drainable collection.  Normal sounding voice.  Neck is supple. Eyes:     General:        Right eye: No discharge.        Left eye: No discharge.     Conjunctiva/sclera: Conjunctivae normal.  Neck:     Musculoskeletal: Neck supple.  Cardiovascular:     Rate and Rhythm: Normal rate and regular rhythm.     Heart sounds: Normal heart sounds. No murmur. No friction rub. No gallop.   Pulmonary:     Effort: Pulmonary effort is normal. No respiratory distress.     Breath sounds: Normal breath sounds.  Abdominal:      General: There is no distension.     Palpations: Abdomen is soft.     Tenderness: There is no abdominal tenderness.  Musculoskeletal:        General: No tenderness.  Skin:    General: Skin is warm and dry.  Neurological:     Mental Status: She is alert.  Psychiatric:        Behavior: Behavior normal.        Thought Content: Thought content normal.      ED Treatments / Results  Labs (all labs ordered are listed, but only abnormal results are displayed) Labs Reviewed - No data to display  EKG    Radiology No results found.  Procedures Procedures (including critical care time)  Medications Ordered in ED Medications - No data to display   Initial Impression / Assessment and Plan / ED Course  I have reviewed the triage vital signs and the nursing notes.  Pertinent labs & imaging results that were available during my care of the patient were reviewed by me and considered in my medical decision making (see chart for details).        Likely periapical abscess.  Airway is fine.  Antibiotics.  PRN NSAIDs.  Needs definitive dental follow-up.  Final Clinical Impressions(s) / ED Diagnoses   Final diagnoses:  Dental abscess    ED Discharge Orders         Ordered    penicillin v potassium (VEETID) 500 MG tablet  4 times daily     02/16/19 0815    traMADol (ULTRAM) 50 MG tablet  Every 6 hours PRN     02/16/19 0815           Raeford RazorKohut, Dayshawn Irizarry, MD 02/18/19 2205

## 2019-03-11 ENCOUNTER — Other Ambulatory Visit: Payer: Self-pay

## 2019-03-11 ENCOUNTER — Emergency Department (HOSPITAL_BASED_OUTPATIENT_CLINIC_OR_DEPARTMENT_OTHER): Payer: Medicaid Other

## 2019-03-11 ENCOUNTER — Emergency Department (HOSPITAL_BASED_OUTPATIENT_CLINIC_OR_DEPARTMENT_OTHER)
Admission: EM | Admit: 2019-03-11 | Discharge: 2019-03-11 | Disposition: A | Discharge status: Home / Self Care | Payer: Medicaid Other | Attending: Emergency Medicine | Admitting: Emergency Medicine

## 2019-03-11 ENCOUNTER — Encounter (HOSPITAL_BASED_OUTPATIENT_CLINIC_OR_DEPARTMENT_OTHER): Payer: Self-pay | Admitting: Emergency Medicine

## 2019-03-11 DIAGNOSIS — Z87891 Personal history of nicotine dependence: Secondary | ICD-10-CM | POA: Diagnosis not present

## 2019-03-11 DIAGNOSIS — R079 Chest pain, unspecified: Secondary | ICD-10-CM | POA: Diagnosis present

## 2019-03-11 DIAGNOSIS — R0789 Other chest pain: Secondary | ICD-10-CM | POA: Diagnosis not present

## 2019-03-11 LAB — PREGNANCY, URINE: Preg Test, Ur: NEGATIVE

## 2019-03-11 MED ORDER — IBUPROFEN 600 MG PO TABS: 600.0000 mg | ORAL_TABLET | Freq: Three times a day (TID) | ORAL | 0 refills | Status: AC | PRN

## 2019-03-11 NOTE — ED Triage Notes (Signed)
Centralized chest pain x 1 hour. States it "feels like a blockage". Endorses SOB. States this happened several days ago and then subsided.

## 2019-03-11 NOTE — ED Provider Notes (Signed)
[facility] EMERGENCY DEPARTMENT Provider Note   CSN: [id number] Arrival date & time: 03/11/19  1656    History   Chief Complaint Chief Complaint  Patient presents with  . Chest Pain    HPI Sharon Stewart is a 23 y.o. female.     23yo female presents with complaint of midsternal chest pain onset 3PM today, reports similar episode a few days ago while at work that lasted about 30 minutes and resolved with rest. Patient feels like something is blocked in her chest, afraid to eat something due to the blocked feeling. Patient is able to drink/swallow without difficulty.      Past Medical History:  Diagnosis Date  . Anemia   . Foreign body of hand, left 02/2012   states is glass  . Sickle cell trait (HCC)     There are no active problems to display for this patient.   Past Surgical History:  Procedure Laterality Date  . FOREIGN BODY REMOVAL  03/16/2012   Procedure: FOREIGN BODY REMOVAL ADULT;  Surgeon: Tami RibasKevin R Kuzma, MD;  Location: [facility];  Service: Orthopedics;  Laterality: Left;  LEFT HAND     OB History   No obstetric history on file.      Home Medications    Prior to Admission medications   Medication Sig Start Date End Date Taking? Authorizing Provider  ibuprofen (ADVIL) 600 MG tablet Take 1 tablet (600 mg total) by mouth every 8 (eight) hours as needed for up to 10 days. 03/11/19 03/21/19  Jeannie FendMurphy, Rabia Argote A, PA-C    Family History Family History  Problem Relation Age of Onset  . Asthma Brother   . Sickle cell trait Brother   . Diabetes Maternal Grandmother   . Hypertension Maternal Grandmother   . Anesthesia problems Mother        post-op nausea  . Anemia Mother   . Sickle cell trait Mother   . Tuberculosis Father        > 12 yrs. ago    Social History Social History   Tobacco Use  . Smoking status: Former Games developermoker  . Smokeless tobacco: Never Used  Substance Use Topics  . Alcohol use: No  . Drug use: No      Allergies   Patient has no known allergies.   Review of Systems Review of Systems  Constitutional: Negative for fever.  HENT: Positive for trouble swallowing.   Respiratory: Negative for shortness of breath.   Cardiovascular: Positive for chest pain.  Gastrointestinal: Negative for abdominal pain, constipation, diarrhea, nausea and vomiting.  Genitourinary: Negative for dysuria, frequency and urgency.  Musculoskeletal: Negative for arthralgias and myalgias.  Skin: Negative for rash and wound.  Allergic/Immunologic: Negative for immunocompromised state.  Psychiatric/Behavioral: Negative for confusion.  All other systems reviewed and are negative.    Physical Exam Updated Vital Signs BP 114/81 (BP Location: Right Arm)   Pulse 72   Temp 98.2 F (36.8 C) (Oral)   Resp (!) 21   Ht 5\' 7"  (1.702 m)   Wt 89.8 kg   LMP 02/23/2019   SpO2 100%   BMI 31.01 kg/m   Physical Exam Vitals signs and nursing note reviewed.  Constitutional:      General: She is not in acute distress.    Appearance: She is well-developed. She is not diaphoretic.  HENT:     Head: Normocephalic and atraumatic.  Neck:     Musculoskeletal: Neck supple.     Thyroid: No  thyromegaly.  Cardiovascular:     Rate and Rhythm: Normal rate and regular rhythm.     Heart sounds: Normal heart sounds.  Pulmonary:     Effort: Pulmonary effort is normal.     Breath sounds: Normal breath sounds. No decreased breath sounds.  Chest:     Chest wall: Tenderness present. No crepitus.    Abdominal:     Palpations: Abdomen is soft.     Tenderness: There is no abdominal tenderness.  Musculoskeletal:     Right lower leg: She exhibits no tenderness. No edema.     Left lower leg: She exhibits no tenderness. No edema.  Skin:    General: Skin is warm and dry.     Findings: No rash.  Neurological:     Mental Status: She is alert and oriented to person, place, and time.  Psychiatric:        Behavior: Behavior normal.       ED Treatments / Results  Labs (all labs ordered are listed, but only abnormal results are displayed) Labs Reviewed  PREGNANCY, URINE    EKG EKG Interpretation  Date/Time:  Sunday March 11 2019 17:09:37 EDT Ventricular Rate:  99 PR Interval:  138 QRS Duration: 72 QT Interval:  350 QTC Calculation: 449 R Axis:   93 Text Interpretation:  Normal sinus rhythm with sinus arrhythmia Rightward axis Pulmonary disease pattern Abnormal ECG No old tracing to compare Confirmed by Malvin Johns [id number]) on 03/11/2019 6:44:29 PM   Radiology Dg Chest 2 View  Result Date: 03/11/2019 CLINICAL DATA:  23 year old with acute onset of mid chest pain earlier today. EXAM: CHEST - 2 VIEW COMPARISON:  None. FINDINGS: Cardiomediastinal silhouette unremarkable. Lungs clear. Bronchovascular markings normal. Pulmonary vascularity normal. No visible pleural effusions. No pneumothorax. Mild thoracic dextroscoliosis and compensatory thoracolumbar levoscoliosis. IMPRESSION: No acute cardiopulmonary disease. Electronically Signed   By: Evangeline Dakin M.D.   On: 03/11/2019 17:35    Procedures Procedures (including critical care time)  Medications Ordered in ED Medications - No data to display   Initial Impression / Assessment and Plan / ED Course  I have reviewed the triage vital signs and the nursing notes.  Pertinent labs & imaging results that were available during my care of the patient were reviewed by me and considered in my medical decision making (see chart for details).  Clinical Course as of Mar 11 2055  Nancy Fetter Mar 10, 7376  127 23 year old female presents with complaint of tenderness over her sternum and associated difficulty swallowing.  Patient reports similar episode a few days ago which resolved with rest and recurred today while she was at work.  Patient is tolerating secretions and able to drink liquids without difficulty.  Pain is reproduced with palpation of her patient's sternum.  Patient  denies shortness of breath, calf or leg swelling, recent extended travel or OCP use.  Patient has no occasional smoker.  No personal or family history of PE or DVT.  Lung sounds are clear, heart regular rate and rhythm, pain reproduced with palpation over mid sternum.  Suspect musculoskeletal chest wall tenderness, recommend treat with a short course of anti-inflammatories and follow-up with PCP.   [LM]    Clinical Course User Index [LM] Tacy Learn, PA-C      Final Clinical Impressions(s) / ED Diagnoses   Final diagnoses:  Chest wall pain    ED Discharge Orders         Ordered    ibuprofen (ADVIL) 600 MG tablet  Every 8 hours PRN     03/11/19 1914           Alden HippMurphy, Safiyya Stokes A, PA-C 03/11/19 2056    Rolan BuccoBelfi, Melanie, MD 03/11/19 2145

## 2019-03-11 NOTE — Discharge Instructions (Addendum)
Take Motrin as prescribed as needed for pain. Follow up with your doctor, referral given to primary care if you do not already have a doctor. Return to the ER for severe or concerning symptoms.

## 2019-08-07 ENCOUNTER — Other Ambulatory Visit: Payer: Self-pay

## 2019-08-07 ENCOUNTER — Emergency Department (HOSPITAL_COMMUNITY): Admission: EM | Admit: 2019-08-07 | Discharge: 2019-08-07 | Discharge status: Against Medical Advice | Payer: 59

## 2019-08-10 ENCOUNTER — Other Ambulatory Visit: Payer: 59

## 2020-09-05 ENCOUNTER — Other Ambulatory Visit: Payer: Self-pay

## 2020-09-05 ENCOUNTER — Ambulatory Visit
Admission: EM | Admit: 2020-09-05 | Discharge: 2020-09-05 | Disposition: A | Discharge status: Home / Self Care | Payer: BC Managed Care – PPO | Attending: Emergency Medicine | Admitting: Emergency Medicine

## 2020-09-05 ENCOUNTER — Ambulatory Visit (INDEPENDENT_AMBULATORY_CARE_PROVIDER_SITE_OTHER): Payer: BC Managed Care – PPO

## 2020-09-05 DIAGNOSIS — S6992XA Unspecified injury of left wrist, hand and finger(s), initial encounter: Secondary | ICD-10-CM | POA: Diagnosis not present

## 2020-09-05 DIAGNOSIS — M79645 Pain in left finger(s): Secondary | ICD-10-CM

## 2020-09-05 NOTE — Discharge Instructions (Signed)

## 2020-09-05 NOTE — ED Triage Notes (Signed)
Pt states play a game last night and either hit her lt thumb or it bent back. Pt c/o swelling and pain. States iced it all night with no relief.

## 2020-09-05 NOTE — ED Provider Notes (Signed)
[facility]    CSN: [id number] Arrival date & time: 09/05/20  1028      History   Chief Complaint Chief Complaint  Patient presents with  . thumb injury    HPI Sharon Stewart is a 25 y.o. female  Presenting for left thumb MCP joint swelling s/p injury that occurred last night.  States that she was "playing a game "with friends and it hyperextended.  Denies pop, deformity, numbness.  Works for [employer] and is concerned that she will be able to perform heavy lifting duties.  Past Medical History:  Diagnosis Date  . Anemia   . Foreign body of hand, left 02/2012   states is glass  . Sickle cell trait (HCC)     There are no problems to display for this patient.   Past Surgical History:  Procedure Laterality Date  . FOREIGN BODY REMOVAL  03/16/2012   Procedure: FOREIGN BODY REMOVAL ADULT;  Surgeon: Tami Ribas, MD;  Location: [facility];  Service: Orthopedics;  Laterality: Left;  LEFT HAND    OB History   No obstetric history on file.      Home Medications    Prior to Admission medications   Not on File    Family History Family History  Problem Relation Age of Onset  . Asthma Brother   . Sickle cell trait Brother   . Diabetes Maternal Grandmother   . Hypertension Maternal Grandmother   . Anesthesia problems Mother        post-op nausea  . Anemia Mother   . Sickle cell trait Mother   . Tuberculosis Father        > 12 yrs. ago    Social History Social History   Tobacco Use  . Smoking status: Former Games developer  . Smokeless tobacco: Never Used  Vaping Use  . Vaping Use: Never used  Substance Use Topics  . Alcohol use: No  . Drug use: No     Allergies   Patient has no known allergies.   Review of Systems Review of Systems  Constitutional: Negative for fatigue and fever.  HENT: Negative for ear pain, sinus pain, sore throat and voice change.   Eyes: Negative for pain, redness and visual disturbance.  Respiratory: Negative  for cough and shortness of breath.   Cardiovascular: Negative for chest pain and palpitations.  Gastrointestinal: Negative for abdominal pain, diarrhea and vomiting.  Musculoskeletal: Negative for arthralgias and myalgias.       (+) L thumb pain, swelling  Skin: Negative for rash and wound.  Neurological: Negative for syncope and headaches.     Physical Exam Triage Vital Signs ED Triage Vitals [09/05/20 1150]  Enc Vitals Group     BP 125/82     Pulse Rate 61     Resp 18     Temp 97.9 F (36.6 C)     Temp Source Oral     SpO2 99 %     Weight      Height      Head Circumference      Peak Flow      Pain Score 10     Pain Loc      Pain Edu?      Excl. in GC?    No data found.  Updated Vital Signs BP 125/82 (BP Location: Left Arm)   Pulse 61   Temp 97.9 F (36.6 C) (Oral)   Resp 18   LMP 08/14/2020  SpO2 99%   Visual Acuity Right Eye Distance:   Left Eye Distance:   Bilateral Distance:    Right Eye Near:   Left Eye Near:    Bilateral Near:     Physical Exam Constitutional:      General: She is not in acute distress. HENT:     Head: Normocephalic and atraumatic.  Eyes:     General: No scleral icterus.    Pupils: Pupils are equal, round, and reactive to light.  Cardiovascular:     Rate and Rhythm: Normal rate.  Pulmonary:     Effort: Pulmonary effort is normal.  Musculoskeletal:        General: Swelling and tenderness present. No deformity. Normal range of motion.     Comments: Left thumb with MCP joint swelling, tenderness.  NVI  Skin:    Capillary Refill: Capillary refill takes less than 2 seconds.     Coloration: Skin is not jaundiced or pale.     Findings: No bruising.  Neurological:     General: No focal deficit present.     Mental Status: She is alert and oriented to person, place, and time.      UC Treatments / Results  Labs (all labs ordered are listed, but only abnormal results are displayed) Labs Reviewed - No data to  display  EKG   Radiology DG Finger Thumb Left  Result Date: 09/05/2020 CLINICAL DATA:  Hyperextension injury. EXAM: LEFT THUMB 2+V COMPARISON:  None. FINDINGS: There is no evidence of fracture or dislocation. There is no evidence of arthropathy or other focal bone abnormality. Soft tissues are unremarkable. IMPRESSION: Negative. Electronically Signed   By: Marlan Palau M.D.   On: 09/05/2020 12:26    Procedures Procedures (including critical care time)  Medications Ordered in UC Medications - No data to display  Initial Impression / Assessment and Plan / UC Course  I have reviewed the triage vital signs and the nursing notes.  Pertinent labs & imaging results that were available during my care of the patient were reviewed by me and considered in my medical decision making (see chart for details).     X-ray negative, will treat supportively as below.  Work-up provided.  Return precautions discussed, pt verbalized understanding and is agreeable to plan. Final Clinical Impressions(s) / UC Diagnoses   Final diagnoses:  Thumb injury, left, initial encounter     Discharge Instructions     RICE: rest, ice, compression, elevation as needed for pain.    Pain medication:  350 mg-1000 mg of Tylenol (acetaminophen) and/or 200 mg - 800 mg of Advil (ibuprofen, Motrin) every 8 hours as needed.  May alternate between the two throughout the day as they are generally safe to take together.  DO NOT exceed more than 3000 mg of Tylenol or 3200 mg of ibuprofen in a 24 hour period as this could damage your stomach, kidneys, liver, or increase your bleeding risk.  Important to follow up with specialist(s) below for further evaluation/management if your symptoms persist or worsen.    ED Prescriptions    None     PDMP not reviewed this encounter.   Hall-Potvin, [place], [place] 09/05/20 1237

## 2021-06-21 IMAGING — DX DG FINGER THUMB 2+V*L*
3 series · 3 of 3 positions shown · non-contrast
Comparison: None.

CLINICAL DATA: Hyperextension injury.

EXAM:
LEFT THUMB 2+V

[thumb pa (1 of 2)]
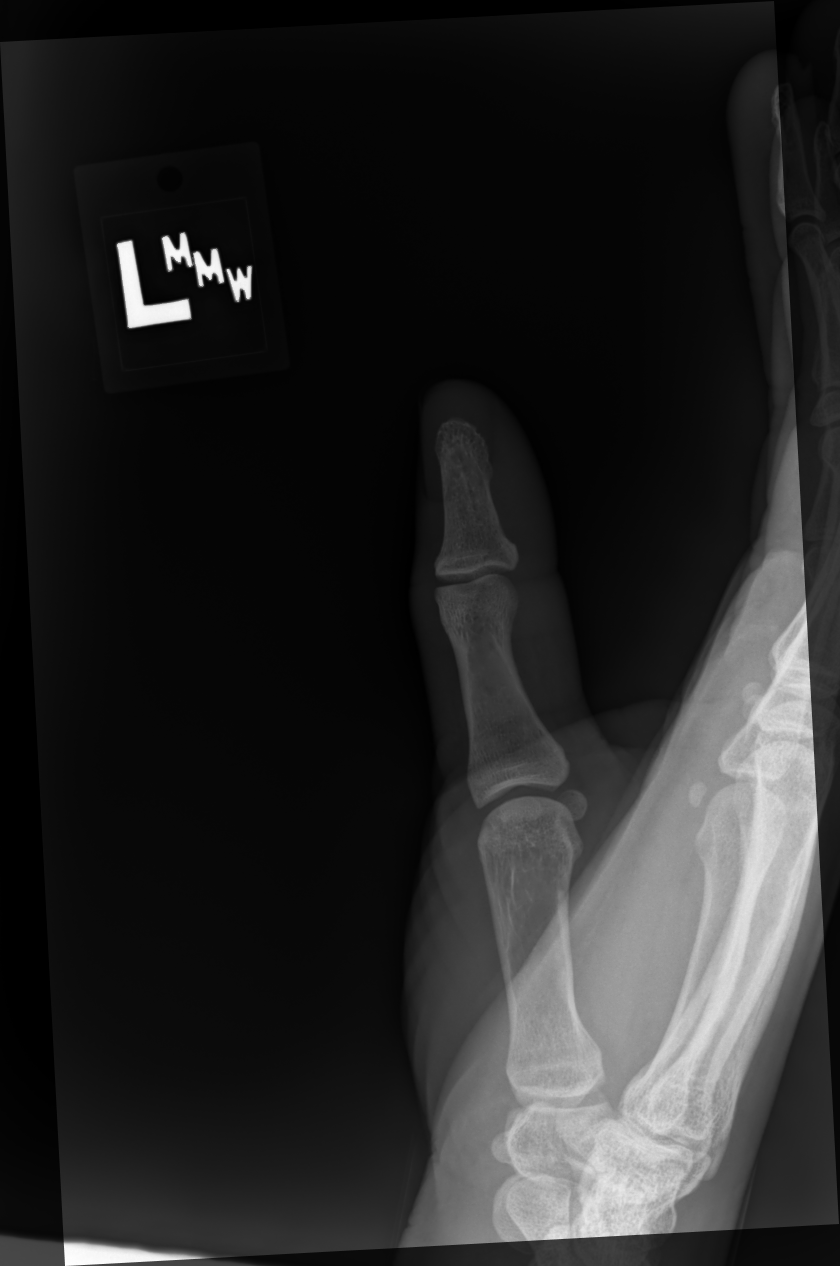

[thumb lat]
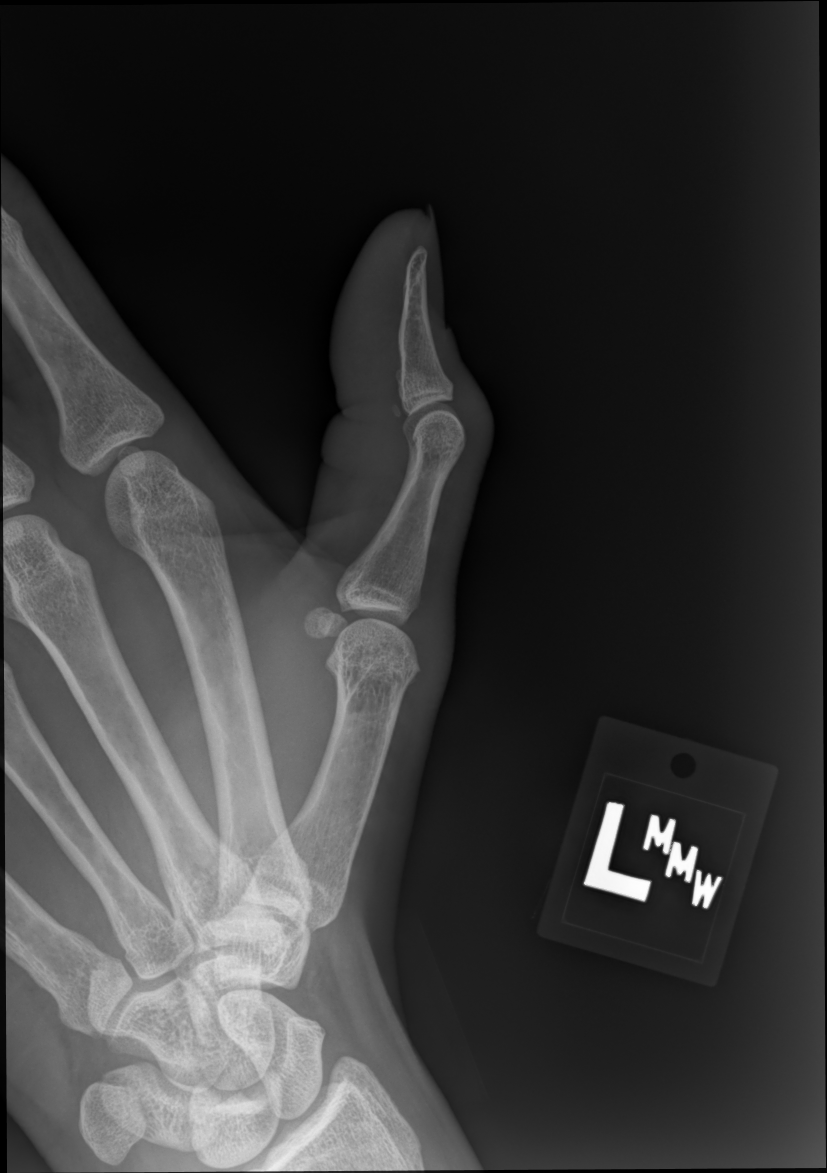

[thumb pa (2 of 2)]
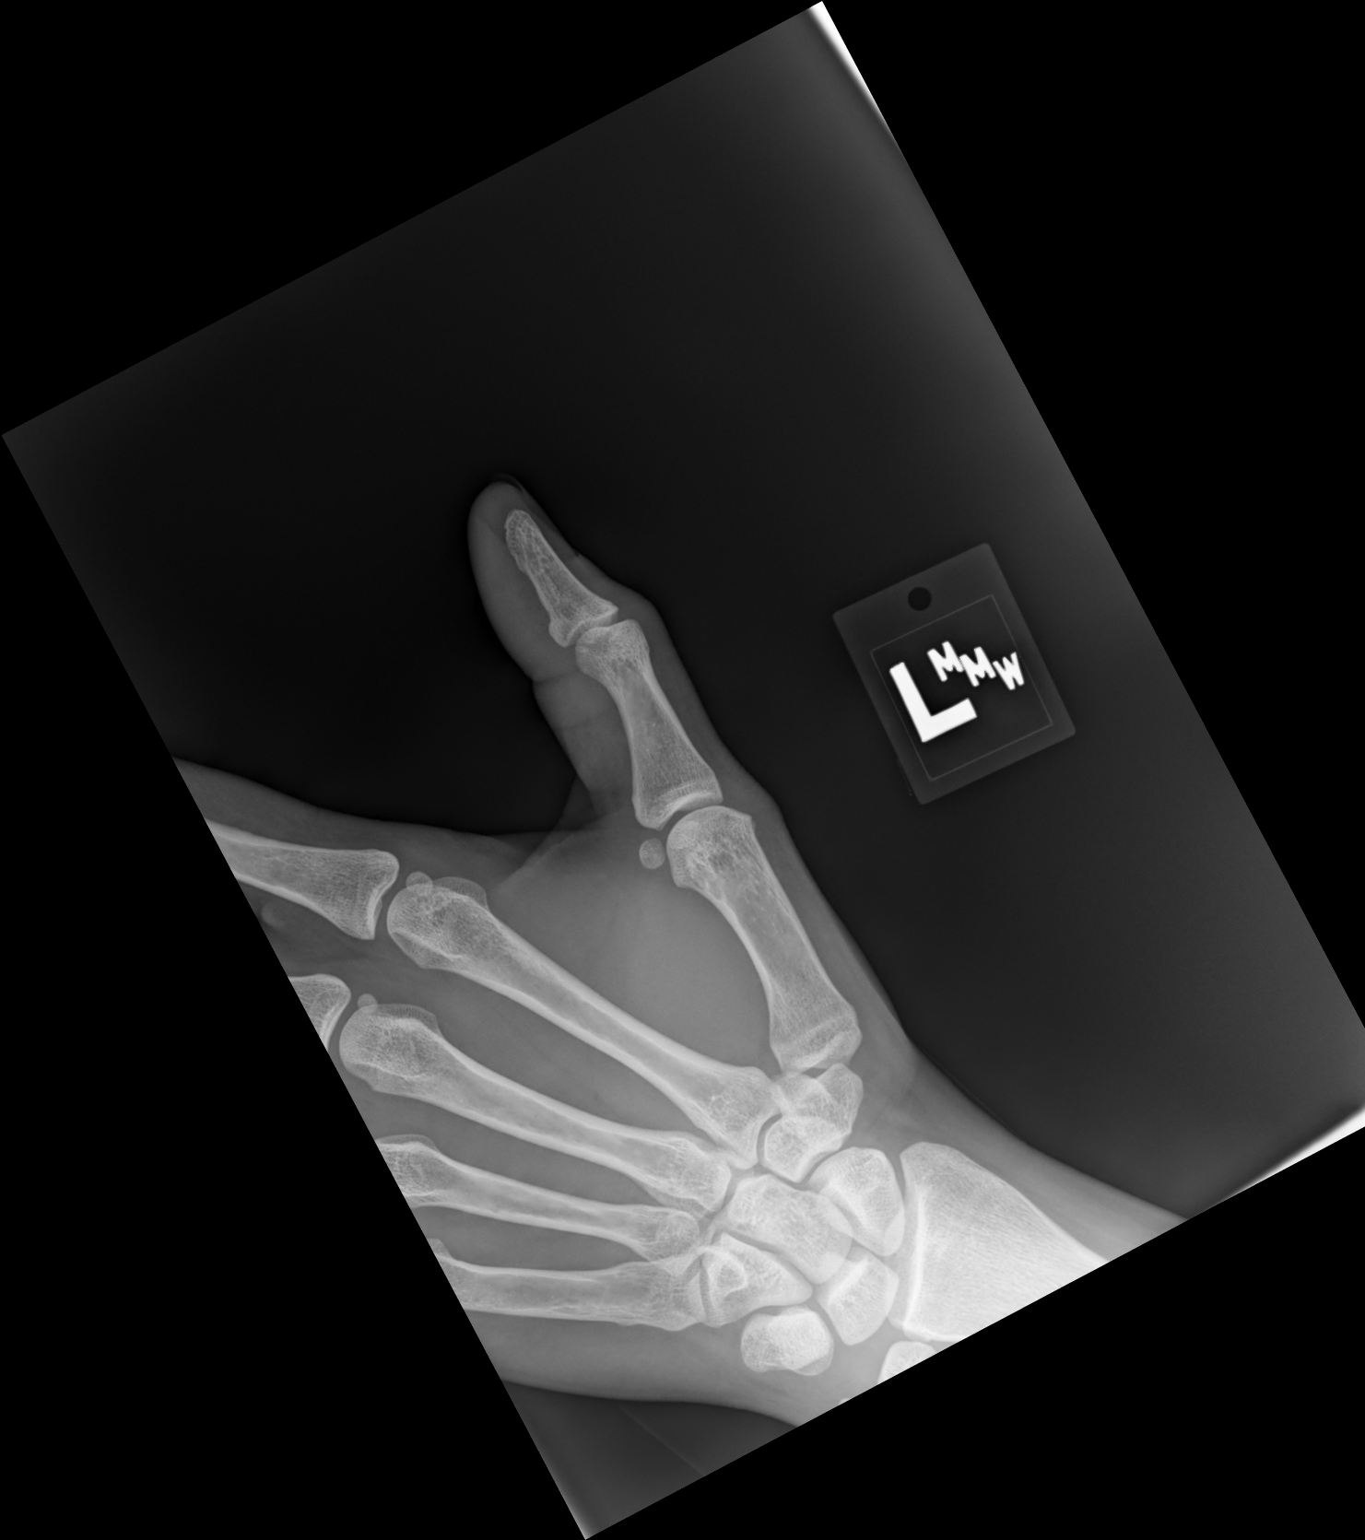

[3 of 3 positions shown; findings below may reference images not displayed]

FINDINGS: There is no evidence of fracture or dislocation. There is no
evidence of arthropathy or other focal bone abnormality. Soft
tissues are unremarkable.
IMPRESSION: Negative.

## 2022-01-01 ENCOUNTER — Other Ambulatory Visit: Payer: Self-pay | Admitting: Obstetrics

## 2022-01-01 DIAGNOSIS — N6320 Unspecified lump in the left breast, unspecified quadrant: Secondary | ICD-10-CM

## 2022-01-15 ENCOUNTER — Ambulatory Visit
Admission: RE | Admit: 2022-01-15 | Discharge: 2022-01-15 | Disposition: A | Discharge status: Home / Self Care | Payer: BC Managed Care – PPO | Source: Ambulatory Visit | Attending: Obstetrics | Admitting: Obstetrics

## 2022-01-15 DIAGNOSIS — N6320 Unspecified lump in the left breast, unspecified quadrant: Secondary | ICD-10-CM

## 2022-10-31 IMAGING — US US BREAST*L* LIMITED INC AXILLA
1 series · 2 of 2 positions shown · non-contrast
Comparison: None Available.

CLINICAL DATA: 26-year-old female with palpable thickening in the
UPPER OUTER LEFT breast discovered on clinical examination.

EXAM:
ULTRASOUND OF THE LEFT BREAST

[Series 1: us breast*left* limited inc axilla · 0.07mm/px · 2 of 2 slices shown]
[im 1/2]
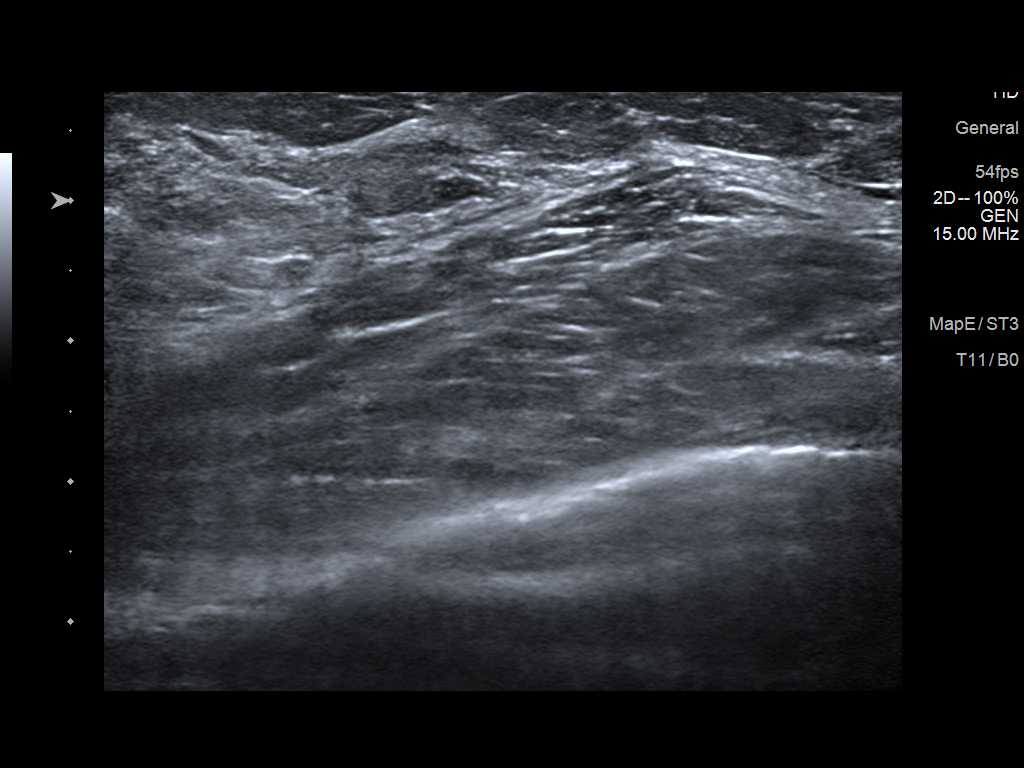
[im 2/2]
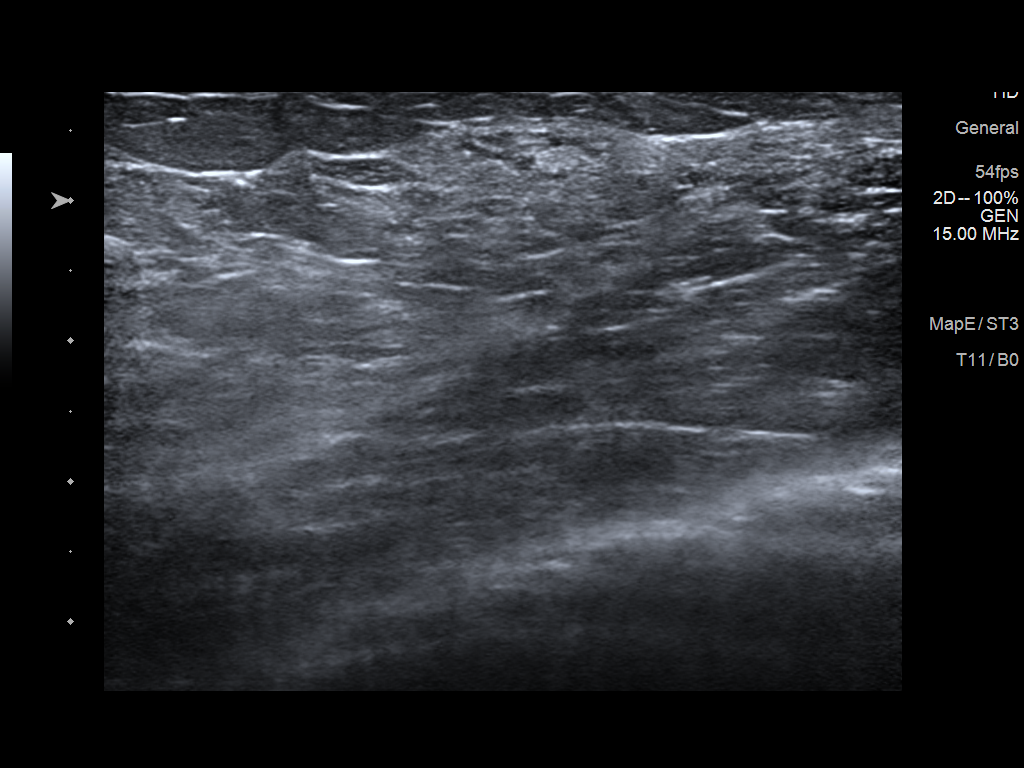

[2 of 2 positions shown; findings below may reference images not displayed]

FINDINGS: On physical exam, mild thickening at the 2 o'clock position of the
LEFT breast 4 cm from the nipple is noted.

Targeted ultrasound is performed, showing an island of normal dense
fibroglandular tissue at the 2 o'clock position of the LEFT breast 4
cm from the nipple, corresponding to the palpable abnormality.
IMPRESSION: Island of normal dense fibroglandular tissue within the UPPER-OUTER
LEFT breast, corresponding to the patient's palpable abnormality.

RECOMMENDATION:
Bilateral screening mammogram at age 42.

Consider clinical follow-up as indicated. Any further workup should
be based on clinical grounds.

I have discussed the findings and recommendations with the patient.
If applicable, a reminder letter will be sent to the patient
regarding the next appointment.

BI-RADS CATEGORY  1: Negative.

## 2022-11-01 ENCOUNTER — Emergency Department (HOSPITAL_BASED_OUTPATIENT_CLINIC_OR_DEPARTMENT_OTHER): Payer: BC Managed Care – PPO

## 2022-11-01 ENCOUNTER — Other Ambulatory Visit: Payer: Self-pay

## 2022-11-01 ENCOUNTER — Encounter (HOSPITAL_BASED_OUTPATIENT_CLINIC_OR_DEPARTMENT_OTHER): Payer: Self-pay

## 2022-11-01 ENCOUNTER — Other Ambulatory Visit (HOSPITAL_BASED_OUTPATIENT_CLINIC_OR_DEPARTMENT_OTHER): Payer: Self-pay

## 2022-11-01 ENCOUNTER — Emergency Department (HOSPITAL_BASED_OUTPATIENT_CLINIC_OR_DEPARTMENT_OTHER)
Admission: EM | Admit: 2022-11-01 | Discharge: 2022-11-01 | Disposition: A | Discharge status: Home / Self Care | Payer: BC Managed Care – PPO | Attending: Emergency Medicine | Admitting: Emergency Medicine

## 2022-11-01 DIAGNOSIS — R102 Pelvic and perineal pain: Secondary | ICD-10-CM | POA: Diagnosis present

## 2022-11-01 DIAGNOSIS — N920 Excessive and frequent menstruation with regular cycle: Secondary | ICD-10-CM

## 2022-11-01 DIAGNOSIS — N946 Dysmenorrhea, unspecified: Secondary | ICD-10-CM | POA: Diagnosis not present

## 2022-11-01 LAB — CBC WITH DIFFERENTIAL/PLATELET
Abs Immature Granulocytes: 0.03 10*3/uL (ref 0.00–0.07)
Basophils Absolute: 0 10*3/uL (ref 0.0–0.1)
Basophils Relative: 0 %
Eosinophils Absolute: 0.1 10*3/uL (ref 0.0–0.5)
Eosinophils Relative: 1 %
HCT: 35.7 % — ABNORMAL LOW (ref 36.0–46.0)
Hemoglobin: 11.5 g/dL — ABNORMAL LOW (ref 12.0–15.0)
Immature Granulocytes: 0 %
Lymphocytes Relative: 24 %
Lymphs Abs: 2.3 10*3/uL (ref 0.7–4.0)
MCH: 21.1 pg — ABNORMAL LOW (ref 26.0–34.0)
MCHC: 32.2 g/dL (ref 30.0–36.0)
MCV: 65.6 fL — ABNORMAL LOW (ref 80.0–100.0)
Monocytes Absolute: 0.5 10*3/uL (ref 0.1–1.0)
Monocytes Relative: 5 %
Neutro Abs: 6.5 10*3/uL (ref 1.7–7.7)
Neutrophils Relative %: 70 %
Platelets: 254 10*3/uL (ref 150–400)
RBC: 5.44 MIL/uL — ABNORMAL HIGH (ref 3.87–5.11)
RDW: 15 % (ref 11.5–15.5)
WBC: 9.4 10*3/uL (ref 4.0–10.5)
nRBC: 0 % (ref 0.0–0.2)

## 2022-11-01 LAB — COMPREHENSIVE METABOLIC PANEL
ALT: 8 U/L (ref 0–44)
AST: 11 U/L — ABNORMAL LOW (ref 15–41)
Albumin: 4.1 g/dL (ref 3.5–5.0)
Alkaline Phosphatase: 69 U/L (ref 38–126)
Anion gap: 9 (ref 5–15)
BUN: 10 mg/dL (ref 6–20)
CO2: 22 mmol/L (ref 22–32)
Calcium: 9.5 mg/dL (ref 8.9–10.3)
Chloride: 106 mmol/L (ref 98–111)
Creatinine, Ser: 0.86 mg/dL (ref 0.44–1.00)
GFR, Estimated: >60 mL/min (ref >60)
Glucose, Bld: 86 mg/dL (ref 70–99)
Potassium: 3.8 mmol/L (ref 3.5–5.1)
Sodium: 137 mmol/L (ref 135–145)
Total Bilirubin: 0.8 mg/dL (ref 0.3–1.2)
Total Protein: 7.3 g/dL (ref 6.5–8.1)

## 2022-11-01 LAB — URINALYSIS, ROUTINE W REFLEX MICROSCOPIC

## 2022-11-01 LAB — URINALYSIS, MICROSCOPIC (REFLEX)
Bacteria, UA: NONE SEEN
RBC / HPF: >50 RBC/hpf (ref 0–5)

## 2022-11-01 LAB — PREGNANCY, URINE: Preg Test, Ur: NEGATIVE

## 2022-11-01 MED ORDER — LACTATED RINGERS IV BOLUS
1000.0000 mL | Freq: Once | INTRAVENOUS | Status: AC
  Administered 2022-11-01: 1000 mL via INTRAVENOUS

## 2022-11-01 MED ORDER — FENTANYL CITRATE PF 50 MCG/ML IJ SOSY
50.0000 ug | PREFILLED_SYRINGE | Freq: Once | INTRAMUSCULAR | Status: DC
  Administered 2022-11-01: 50 ug via INTRAVENOUS
  Filled 2022-11-01: qty 1

## 2022-11-01 MED ORDER — ONDANSETRON HCL 4 MG/2ML IJ SOLN
4.0000 mg | Freq: Once | INTRAMUSCULAR | Status: AC
  Administered 2022-11-01: 4 mg via INTRAVENOUS
  Filled 2022-11-01: qty 2

## 2022-11-01 MED ORDER — KETOROLAC TROMETHAMINE 15 MG/ML IJ SOLN
15.0000 mg | Freq: Once | INTRAMUSCULAR | Status: AC
  Administered 2022-11-01: 15 mg via INTRAVENOUS
  Filled 2022-11-01: qty 1

## 2022-11-01 NOTE — ED Provider Notes (Signed)
[facility] Provider Note   CSN: [id number] Arrival date & time: 11/01/22  1107     History  Chief Complaint  Patient presents with   Pelvic Pain    Sharon Stewart is a 27 y.o. female.   Pelvic Pain     27 year old female presenting to the emergency department with pelvic pain.  The patient states that she normally gets heavy and painful menstrual periods.  She has been previously on TXA in the past for heavy menstrual periods.  She also  Home Medications Prior to Admission medications   Not on File      Allergies    Patient has no known allergies.    Review of Systems   Review of Systems  Genitourinary:  Positive for pelvic pain.    Physical Exam Updated Vital Signs BP (!) 145/98 (BP Location: Right Arm)   Pulse 79   Temp 99.1 F (37.3 C) (Oral)   Resp 18   Ht '5\' 8"'$  (1.727 m)   Wt 96.6 kg   SpO2 100%   BMI 32.39 kg/m  Physical Exam  ED Results / Procedures / Treatments   Labs (all labs ordered are listed, but only abnormal results are displayed) Labs Reviewed  URINALYSIS, ROUTINE W REFLEX MICROSCOPIC - Abnormal; Notable for the following components:      Result Value   Color, Urine RED (*)    APPearance CLOUDY (*)    Glucose, UA   (*)    Value: TEST NOT REPORTED DUE TO COLOR INTERFERENCE OF URINE PIGMENT   Hgb urine dipstick   (*)    Value: TEST NOT REPORTED DUE TO COLOR INTERFERENCE OF URINE PIGMENT   Bilirubin Urine   (*)    Value: TEST NOT REPORTED DUE TO COLOR INTERFERENCE OF URINE PIGMENT   Ketones, ur   (*)    Value: TEST NOT REPORTED DUE TO COLOR INTERFERENCE OF URINE PIGMENT   Protein, ur   (*)    Value: TEST NOT REPORTED DUE TO COLOR INTERFERENCE OF URINE PIGMENT   Nitrite   (*)    Value: TEST NOT REPORTED DUE TO COLOR INTERFERENCE OF URINE PIGMENT   Leukocytes,Ua   (*)    Value: TEST NOT REPORTED DUE TO COLOR INTERFERENCE OF URINE PIGMENT   All other components within normal limits   PREGNANCY, URINE  URINALYSIS, MICROSCOPIC (REFLEX)    EKG None  Radiology No results found.  Procedures Procedures  {Document cardiac monitor, telemetry assessment procedure when appropriate:1}  Medications Ordered in ED Medications - No data to display  ED Course/ Medical Decision Making/ A&P   {   Click here for ABCD2, HEART and other calculatorsREFRESH Note before signing :1}                          Medical Decision Making Amount and/or Complexity of Data Reviewed Labs: ordered. Radiology: ordered.  Risk Prescription drug management.   ***  {Document critical care time when appropriate:1} {Document review of labs and clinical decision tools ie heart score, Chads2Vasc2 etc:1}  {Document your independent review of radiology images, and any outside records:1} {Document your discussion with family members, caretakers, and with consultants:1} {Document social determinants of health affecting pt's care:1} {Document your decision making why or why not admission, treatments were needed:1} Final Clinical Impression(s) / ED Diagnoses Final diagnoses:  None    Rx / DC Orders ED Discharge Orders  None       

## 2022-11-01 NOTE — Discharge Instructions (Addendum)
Schedule follow-up with your OB/GYN.  Your laboratory evaluation and imaging evaluation was reassuring today.

## 2022-11-01 NOTE — ED Triage Notes (Signed)
Patient here POV from Home.  Endorses Menstrual Cycle that began last PM. Endorses Pain to the entire Left Side of her Body and localizes to LLQ/Pelvic Area.  Associated with Nausea. No Emesis. Some Diarrhea.   NAD noted during Triage. A&Ox4. GCS 15. Ambulatory.

## 2024-05-19 ENCOUNTER — Emergency Department (HOSPITAL_BASED_OUTPATIENT_CLINIC_OR_DEPARTMENT_OTHER)
Admission: EM | Admit: 2024-05-19 | Discharge: 2024-05-19 | Disposition: A | Discharge status: Home / Self Care | Attending: Emergency Medicine | Admitting: Emergency Medicine

## 2024-05-19 ENCOUNTER — Other Ambulatory Visit: Payer: Self-pay

## 2024-05-19 ENCOUNTER — Encounter (HOSPITAL_BASED_OUTPATIENT_CLINIC_OR_DEPARTMENT_OTHER): Payer: Self-pay

## 2024-05-19 DIAGNOSIS — Z20822 Contact with and (suspected) exposure to covid-19: Secondary | ICD-10-CM | POA: Insufficient documentation

## 2024-05-19 DIAGNOSIS — R490 Dysphonia: Secondary | ICD-10-CM | POA: Insufficient documentation

## 2024-05-19 LAB — RESP PANEL BY RT-PCR (RSV, FLU A&B, COVID)  RVPGX2
Influenza A by PCR: NEGATIVE
Influenza B by PCR: NEGATIVE
Resp Syncytial Virus by PCR: NEGATIVE
SARS Coronavirus 2 by RT PCR: NEGATIVE

## 2024-05-19 LAB — GROUP A STREP BY PCR: Group A Strep by PCR: NOT DETECTED

## 2024-05-19 NOTE — ED Triage Notes (Signed)
 Reports yesterday began losing voice and woke up this morning with voice  Completely gone. Voice hoarse in triage. Reports sick contacts at work. Requesting covid testing. Denies any sx.

## 2024-05-19 NOTE — ED Provider Notes (Signed)
  EMERGENCY DEPARTMENT AT [facility] Provider Note   CSN: [id number] Arrival date & time: 05/19/24  8167     Patient presents with: No chief complaint on file.   Sharon Stewart is a 28 y.o. female.  {Add pertinent medical, surgical, social history, OB history to HPI:5855} 28 year old female who presents to the emergency department with difficulty speaking.  Patient reports that last night she started having a hoarse voice.  Says that it progressed this morning.  No difficulty swallowing or eating.  No fevers or chills or runny nose or sore throat.  Does report that she has 2 coworkers that are sick with COVID and is concerned that she may have it as well.  Says that she does not sing.       Prior to Admission medications   Not on File    Allergies: Patient has no known allergies.    Review of Systems  Updated Vital Signs BP 132/85   Pulse 73   Temp (!) 97.5 F (36.4 C) (Temporal)   Resp 18   Ht 5' 8 (1.727 m)   Wt 108.9 kg   LMP 05/14/2024 (Exact Date)   SpO2 100%   BMI 36.49 kg/m   Physical Exam Constitutional:      Appearance: Normal appearance.  HENT:     Head: Normocephalic and atraumatic.     Right Ear: Tympanic membrane, ear canal and external ear normal.     Left Ear: Tympanic membrane, ear canal and external ear normal.     Nose: Nose normal.     Mouth/Throat:     Mouth: Mucous membranes are moist.     Pharynx: Posterior oropharyngeal erythema present. No oropharyngeal exudate.  Eyes:     Extraocular Movements: Extraocular movements intact.     Conjunctiva/sclera: Conjunctivae normal.     Pupils: Pupils are equal, round, and reactive to light.  Cardiovascular:     Rate and Rhythm: Normal rate and regular rhythm.     Pulses: Normal pulses.     Heart sounds: Normal heart sounds. No murmur heard. Pulmonary:     Effort: Pulmonary effort is normal.     Breath sounds: Normal breath sounds.  Musculoskeletal:     Cervical back: Normal  range of motion and neck supple. No rigidity.  Lymphadenopathy:     Cervical: No cervical adenopathy.  Neurological:     Mental Status: She is alert.     (all labs ordered are listed, but only abnormal results are displayed) Labs Reviewed  RESP PANEL BY RT-PCR (RSV, FLU A&B, COVID)  RVPGX2  GROUP A STREP BY PCR    EKG: None  Radiology: No results found.  {Document cardiac monitor, telemetry assessment procedure when appropriate:32947} Procedures   Medications Ordered in the ED - No data to display    {Click here for ABCD2, HEART and other calculators REFRESH Note before signing:1}                              Medical Decision Making  ***  {Document critical care time when appropriate  Document review of labs and clinical decision tools ie CHADS2VASC2, etc  Document your independent review of radiology images and any outside records  Document your discussion with family members, caretakers and with consultants  Document social determinants of health affecting pt's care  Document your decision making why or why not admission, treatments were needed:32947:::1}   Final diagnoses:  None    ED Discharge Orders     None

## 2024-05-19 NOTE — Discharge Instructions (Signed)
 You were seen for your hoarse voice in the emergency department.  It is likely from laryngitis  At home, please use tea with honey and avoid yelling or speaking loudly.    Check your MyChart online for the results of any tests that had not resulted by the time you left the emergency department.   Follow-up with your primary doctor in 2-3 days regarding your visit.    Return immediately to the emergency department if you experience any of the following: Fevers, difficulty breathing, difficulty swallowing, or any other concerning symptoms.    Thank you for visiting our Emergency Department. It was a pleasure taking care of you today.

## 2024-07-10 ENCOUNTER — Encounter: Payer: Self-pay | Admitting: Podiatry

## 2024-07-10 ENCOUNTER — Ambulatory Visit: Admitting: Podiatry

## 2024-07-10 DIAGNOSIS — L03032 Cellulitis of left toe: Secondary | ICD-10-CM

## 2024-07-10 DIAGNOSIS — L600 Ingrowing nail: Secondary | ICD-10-CM | POA: Diagnosis not present

## 2024-07-10 MED ORDER — CEPHALEXIN 500 MG PO CAPS: 500.0000 mg | ORAL_CAPSULE | Freq: Four times a day (QID) | ORAL | 0 refills | Status: DC

## 2024-07-10 NOTE — Patient Instructions (Signed)

## 2024-07-10 NOTE — Progress Notes (Signed)
       Subjective:  Patient ID: Sharon Stewart, female    DOB: 05/27/1996,  MRN: [id number]  Sharon Stewart presents to clinic today for:  Chief Complaint  Patient presents with   Ingrown Toenail    Left great toe medial border ingrown. 8 pain. Non diabetic. Wears steel toe shoes at work.   Patient presents with a female companion today for concern and ingrown toenail to the left great toe medial border.  States it is quite painful.  She is required to wear steel toed boots at work and states this aggravates it.  She used to work for [employer] and struck the toe often.  States that there was a small amount of pus expressed last week.  She has not seen any other providers for this.  No Known Allergies  Objective:  Sharon Stewart is a pleasant 28 y.o. female in NAD. AAO x 3.  Vascular Examination: Capillary refill time is 3-5 seconds to toes bilateral. Palpable pedal pulses b/l LE. Digital hair present b/l. No pedal edema b/l. Skin temperature gradient WNL b/l. No varicosities b/l. No cyanosis or clubbing noted b/l.   Dermatological Examination: There is incurvation of the left hallux medial nail border.  There is pain on palpation of the affected nail border.  There is localized erythema and edema.  No active drainage is noted  Neurological Examination: Epicritic sensation is intact to the toes.  Assessment/Plan: 1. Ingrown toenail     Meds ordered this encounter  Medications   cephALEXin  (KEFLEX ) 500 MG capsule    Sig: Take 1 capsule (500 mg total) by mouth 4 (four) times daily for 7 days.    Dispense:  28 capsule    Refill:  0   Discussed patient's condition today.  After obtaining patient consent, the left hallux was anesthetized with a 50:50 mixture of 1% lidocaine  plain and 0.5% bupivacaine  plain for a total of 3cc's administered.  Upon confirmation of anesthesia, a freer elevator was utilized to free the left hallux medial nail border from the nail bed.  The nail border was then avulsed  proximal to the eponychium and removed in toto.  The area was inspected for any remaining spicules.  A chemical matrixectomy was performed with NaOH and neutralized with acetic acid solution.  Antibiotic ointment and a DSD were applied, followed by a Coban dressing.  Patient tolerated the anesthetic and procedure well and will f/u in 2-3 weeks for recheck.  Patient given post-procedure instructions for daily 15-minute Epsom salt soaks, antibiotic ointment and daily use of Bandaids until toe starts to dry / form eschar.   Prescription for cephalexin  500 mg 1 capsule p.o. 4 times daily was sent to her pharmacy on file  Return in about 2 weeks (around 07/24/2024) for PNA recheck.   Awanda CHARM Imperial, DPM, FACFAS [facility]     [address] [place], [place] [address]                Office [phone]  Fax [phone]

## 2024-07-11 ENCOUNTER — Telehealth: Payer: Self-pay | Admitting: Podiatry

## 2024-07-11 MED ORDER — CEPHALEXIN 500 MG PO CAPS: 500.0000 mg | ORAL_CAPSULE | Freq: Four times a day (QID) | ORAL | 0 refills | Status: AC

## 2024-07-11 NOTE — Telephone Encounter (Signed)
 Patient is requesting to send all prescriptions to [facility] on [address] in [place], [address] [place] [address]/Pt's contact# [phone]

## 2024-07-11 NOTE — Telephone Encounter (Signed)
 Pharmacy updated to patient's preferred pharmacy. RX was sent to the corrected pharmacy as well.

## 2024-07-23 ENCOUNTER — Encounter: Payer: Self-pay | Admitting: Podiatry

## 2024-07-23 ENCOUNTER — Ambulatory Visit (INDEPENDENT_AMBULATORY_CARE_PROVIDER_SITE_OTHER): Admitting: Podiatry

## 2024-07-23 DIAGNOSIS — L600 Ingrowing nail: Secondary | ICD-10-CM | POA: Diagnosis not present

## 2024-07-23 NOTE — Progress Notes (Signed)
       Subjective:  Patient ID: Sharon Stewart, female    DOB: [birth year],  MRN: [id number]  Chief Complaint  Patient presents with   Wound Check     Left great toe medial border ingrown. 0 pain. Non diabetic.Cephalexin  taken.      Sharon Stewart presents to clinic today for f/u of PNA to the left hallux medial border.  She denies any pain at this time states that she has been soaking.  She is covering with a Band-Aid when it works at night.  PCP is Patient, No Pcp Per.  No Known Allergies  Objective:  Vascular Examination: Capillary refill time is 3-5 seconds to toes bilateral. Palpable pedal pulses b/l LE. Digital hair present b/l. No pedal edema b/l. Skin temperature gradient WNL b/l. No varicosities b/l. No cyanosis or clubbing noted b/l.   Dermatological Examination: Upon inspection of the PNA site, there are no clinical signs of infection.  No purulence, no necrosis, no malodor present.  Minimal to no erythema present.  Eschar not yet formed along nail margin.  Minimal to no pain on palpation of area.   Assessment/Plan: 1. Ingrown toenail     Iodine solution and a Band-Aid were applied today.  She will continue with this once daily until a dry scab forms then she can discontinue all instructions.  She can purchase the iodine solution from the drugstore.  Follow-up as needed  Return if symptoms worsen or fail to improve.   Awanda CHARM Imperial, DPM, FACFAS [facility]     [address] [place], [place] [address]                Office [phone]  Fax [phone]
